# Patient Record
Sex: Female | Born: 1990
Health system: Southern US, Community
[De-identification: ages and names within clinical notes are randomized; demographics above are authoritative.]

## PROBLEM LIST (undated history)

## (undated) ENCOUNTER — Inpatient Hospital Stay (HOSPITAL_COMMUNITY): Payer: Self-pay

## (undated) DIAGNOSIS — D649 Anemia, unspecified: Secondary | ICD-10-CM

## (undated) DIAGNOSIS — F419 Anxiety disorder, unspecified: Secondary | ICD-10-CM

## (undated) DIAGNOSIS — F329 Major depressive disorder, single episode, unspecified: Secondary | ICD-10-CM

## (undated) DIAGNOSIS — F32A Depression, unspecified: Secondary | ICD-10-CM

## (undated) HISTORY — PX: EYE SURGERY: SHX253

## (undated) HISTORY — PX: WISDOM TOOTH EXTRACTION: SHX21

## (undated) HISTORY — DX: Anemia, unspecified: D64.9

## (undated) HISTORY — DX: Major depressive disorder, single episode, unspecified: F32.9

## (undated) HISTORY — DX: Depression, unspecified: F32.A

## (undated) HISTORY — DX: Anxiety disorder, unspecified: F41.9

## (undated) HISTORY — PX: OTHER SURGICAL HISTORY: SHX169

---

## 2011-07-27 ENCOUNTER — Ambulatory Visit (INDEPENDENT_AMBULATORY_CARE_PROVIDER_SITE_OTHER): Payer: 59

## 2011-07-27 DIAGNOSIS — D509 Iron deficiency anemia, unspecified: Secondary | ICD-10-CM

## 2011-07-27 DIAGNOSIS — J039 Acute tonsillitis, unspecified: Secondary | ICD-10-CM

## 2012-03-04 ENCOUNTER — Other Ambulatory Visit: Payer: Self-pay

## 2012-04-28 ENCOUNTER — Other Ambulatory Visit (HOSPITAL_COMMUNITY): Payer: Self-pay | Admitting: Obstetrics and Gynecology

## 2012-04-28 DIAGNOSIS — IMO0002 Reserved for concepts with insufficient information to code with codable children: Secondary | ICD-10-CM

## 2012-05-09 ENCOUNTER — Encounter (HOSPITAL_COMMUNITY): Payer: Self-pay | Admitting: Obstetrics and Gynecology

## 2012-05-16 ENCOUNTER — Ambulatory Visit (HOSPITAL_COMMUNITY)
Admission: RE | Admit: 2012-05-16 | Discharge: 2012-05-16 | Disposition: A | Discharge status: Home / Self Care | Payer: 59 | Source: Ambulatory Visit | Attending: Obstetrics and Gynecology | Admitting: Obstetrics and Gynecology

## 2012-05-16 ENCOUNTER — Encounter (HOSPITAL_COMMUNITY): Payer: Self-pay

## 2012-05-16 VITALS — BP 122/79 | HR 89 | Wt 138.0 lb

## 2012-05-16 DIAGNOSIS — Z363 Encounter for antenatal screening for malformations: Secondary | ICD-10-CM | POA: Insufficient documentation

## 2012-05-16 DIAGNOSIS — Z1389 Encounter for screening for other disorder: Secondary | ICD-10-CM | POA: Insufficient documentation

## 2012-05-16 DIAGNOSIS — IMO0002 Reserved for concepts with insufficient information to code with codable children: Secondary | ICD-10-CM

## 2012-05-16 DIAGNOSIS — O358XX0 Maternal care for other (suspected) fetal abnormality and damage, not applicable or unspecified: Secondary | ICD-10-CM | POA: Insufficient documentation

## 2012-06-13 ENCOUNTER — Ambulatory Visit (HOSPITAL_COMMUNITY): Admission: RE | Admit: 2012-06-13 | Payer: 59 | Source: Ambulatory Visit

## 2012-06-16 ENCOUNTER — Other Ambulatory Visit (HOSPITAL_COMMUNITY): Payer: Self-pay | Admitting: Obstetrics and Gynecology

## 2012-06-16 DIAGNOSIS — IMO0002 Reserved for concepts with insufficient information to code with codable children: Secondary | ICD-10-CM

## 2012-07-09 ENCOUNTER — Encounter (HOSPITAL_COMMUNITY): Payer: Self-pay

## 2012-07-09 ENCOUNTER — Ambulatory Visit (HOSPITAL_COMMUNITY)
Admission: RE | Admit: 2012-07-09 | Discharge: 2012-07-09 | Disposition: A | Discharge status: Home / Self Care | Payer: 59 | Source: Ambulatory Visit | Attending: Obstetrics and Gynecology | Admitting: Obstetrics and Gynecology

## 2012-07-09 VITALS — BP 113/68 | HR 83 | Wt 148.5 lb

## 2012-07-09 DIAGNOSIS — O358XX0 Maternal care for other (suspected) fetal abnormality and damage, not applicable or unspecified: Secondary | ICD-10-CM | POA: Insufficient documentation

## 2012-07-09 DIAGNOSIS — Z3689 Encounter for other specified antenatal screening: Secondary | ICD-10-CM | POA: Insufficient documentation

## 2012-07-09 DIAGNOSIS — IMO0002 Reserved for concepts with insufficient information to code with codable children: Secondary | ICD-10-CM

## 2012-07-09 NOTE — Progress Notes (Signed)
Jade Webster  was seen today for an ultrasound appointment.  See full report in [facility].  Impression: IUP at 29 5/7 weeks Duplicated collecting system on left with partial obstruction at the ureterovesicular junction- left hydroureter noted Grade IV left hydronephrosis; minimal renal tissue visible on the left which appears somewhat echogenic The right kidney and bladder appear normal.  No ureterocele is appreciated. Otherwise, normal interval anatomy. Interval growth is appropriate (78th %tile) Normal amniotic fluid volume   Patient was previous seen by [facility]rology at [facility].  Recommendations: Recommend follow up ultrasound in 4 weeks.  May deliver at [facility] - but will likely need Peds Urology follow up after delivery.  [name], MD

## 2012-08-06 ENCOUNTER — Ambulatory Visit (HOSPITAL_COMMUNITY)
Admission: RE | Admit: 2012-08-06 | Discharge: 2012-08-06 | Disposition: A | Discharge status: Home / Self Care | Payer: 59 | Source: Ambulatory Visit | Attending: Obstetrics and Gynecology | Admitting: Obstetrics and Gynecology

## 2012-08-06 VITALS — BP 119/68 | HR 81 | Wt 154.0 lb

## 2012-08-06 DIAGNOSIS — IMO0002 Reserved for concepts with insufficient information to code with codable children: Secondary | ICD-10-CM

## 2012-08-06 DIAGNOSIS — O358XX0 Maternal care for other (suspected) fetal abnormality and damage, not applicable or unspecified: Secondary | ICD-10-CM | POA: Insufficient documentation

## 2012-08-14 ENCOUNTER — Ambulatory Visit (HOSPITAL_COMMUNITY)
Admission: RE | Admit: 2012-08-14 | Discharge: 2012-08-14 | Disposition: A | Discharge status: Home / Self Care | Payer: 59 | Source: Ambulatory Visit | Attending: Obstetrics and Gynecology | Admitting: Obstetrics and Gynecology

## 2012-08-14 VITALS — BP 120/67 | HR 96 | Wt 155.5 lb

## 2012-08-14 DIAGNOSIS — Q6239 Other obstructive defects of renal pelvis and ureter: Secondary | ICD-10-CM | POA: Insufficient documentation

## 2012-08-14 DIAGNOSIS — IMO0002 Reserved for concepts with insufficient information to code with codable children: Secondary | ICD-10-CM

## 2012-08-14 DIAGNOSIS — O358XX0 Maternal care for other (suspected) fetal abnormality and damage, not applicable or unspecified: Secondary | ICD-10-CM | POA: Insufficient documentation

## 2012-08-14 NOTE — Progress Notes (Signed)
Jade Webster  was seen today for an ultrasound appointment.  See full report in [facility].  Impression: IUP at 34+6 weeks Left duplicated collection system with UVJ obstruction and left hydroureter and grade 2 hydronephrosis on the right kidney Normal amniotic fluid volume (AFI 7.7 cm) BPP 8/10 (-2 for lack of sustained breathing activity), reactive NST  Recommendations: Recommend continued 2x weekly antepartum testing - weekly BPPs / amniotic fluid assessment with MFM and weekly NSTs at referring clinic Will follow up with [name] [facility]  urology) given new finding of right hydronephrosis, but in light of normal amniotic fluid volume, doubt that this will impact on delivery plan  [name], MD

## 2012-08-14 NOTE — Progress Notes (Signed)
Pt manually entered into obix due to invalid patient message.  Search under MR to view fetal tracing.

## 2012-08-18 LAB — OB RESULTS CONSOLE GBS: GBS: NEGATIVE

## 2012-08-21 ENCOUNTER — Ambulatory Visit (HOSPITAL_COMMUNITY)
Admission: RE | Admit: 2012-08-21 | Discharge: 2012-08-21 | Disposition: A | Discharge status: Home / Self Care | Payer: 59 | Source: Ambulatory Visit | Attending: Obstetrics and Gynecology | Admitting: Obstetrics and Gynecology

## 2012-08-21 ENCOUNTER — Other Ambulatory Visit (HOSPITAL_COMMUNITY): Payer: Self-pay | Admitting: Maternal and Fetal Medicine

## 2012-08-21 VITALS — BP 111/77 | HR 86 | Wt 158.0 lb

## 2012-08-21 DIAGNOSIS — IMO0002 Reserved for concepts with insufficient information to code with codable children: Secondary | ICD-10-CM

## 2012-08-21 DIAGNOSIS — O358XX0 Maternal care for other (suspected) fetal abnormality and damage, not applicable or unspecified: Secondary | ICD-10-CM | POA: Insufficient documentation

## 2012-08-21 DIAGNOSIS — Z363 Encounter for antenatal screening for malformations: Secondary | ICD-10-CM | POA: Insufficient documentation

## 2012-08-21 DIAGNOSIS — Z1389 Encounter for screening for other disorder: Secondary | ICD-10-CM | POA: Insufficient documentation

## 2012-08-21 NOTE — Progress Notes (Signed)
Jade Webster  was seen today for an ultrasound appointment.  See full report in [facility].  Impression: IUP at 35+6 weeks Left duplicated collection system with UVJ obstruction and left hydroureter and grade 2 hydronephrosis on the right kidney Normal amniotic fluid volume (AFI 10.8 cm) BPP 8/8  Contracted Dr. Yetta Webster - Peds Urology [facility]) - recommends no change to current plan.  May deliver at [facility] with renal ultrasound after delivery, antibiotic prophylaxis for newborn and follow up with Peds Urology after delivery.  Recommendations: Recommend continued 2x weekly antepartum testing - weekly BPPs / amniotic fluid assessment with MFM and weekly NSTs at referring clinic  Jade Gula, MD

## 2012-08-26 ENCOUNTER — Other Ambulatory Visit (HOSPITAL_COMMUNITY): Payer: Self-pay | Admitting: Obstetrics and Gynecology

## 2012-08-26 DIAGNOSIS — IMO0002 Reserved for concepts with insufficient information to code with codable children: Secondary | ICD-10-CM

## 2012-08-28 ENCOUNTER — Encounter (HOSPITAL_COMMUNITY): Payer: Self-pay

## 2012-08-28 ENCOUNTER — Inpatient Hospital Stay (HOSPITAL_COMMUNITY)
Admission: AD | Admit: 2012-08-28 | Discharge: 2012-08-28 | Disposition: A | Discharge status: Home / Self Care | Payer: 59 | Source: Ambulatory Visit | Attending: Obstetrics and Gynecology | Admitting: Obstetrics and Gynecology

## 2012-08-28 ENCOUNTER — Ambulatory Visit (HOSPITAL_COMMUNITY)
Admission: RE | Admit: 2012-08-28 | Discharge: 2012-08-28 | Disposition: A | Discharge status: Home / Self Care | Payer: 59 | Source: Ambulatory Visit | Attending: Obstetrics and Gynecology | Admitting: Obstetrics and Gynecology

## 2012-08-28 ENCOUNTER — Encounter (HOSPITAL_COMMUNITY): Payer: Self-pay | Admitting: *Deleted

## 2012-08-28 VITALS — BP 122/75 | HR 81 | Wt 161.0 lb

## 2012-08-28 DIAGNOSIS — O358XX0 Maternal care for other (suspected) fetal abnormality and damage, not applicable or unspecified: Secondary | ICD-10-CM | POA: Insufficient documentation

## 2012-08-28 DIAGNOSIS — IMO0002 Reserved for concepts with insufficient information to code with codable children: Secondary | ICD-10-CM

## 2012-08-28 DIAGNOSIS — O360990 Maternal care for other rhesus isoimmunization, unspecified trimester, not applicable or unspecified: Secondary | ICD-10-CM | POA: Insufficient documentation

## 2012-08-28 DIAGNOSIS — Z298 Encounter for other specified prophylactic measures: Secondary | ICD-10-CM | POA: Insufficient documentation

## 2012-08-28 DIAGNOSIS — Z2989 Encounter for other specified prophylactic measures: Secondary | ICD-10-CM | POA: Insufficient documentation

## 2012-08-28 DIAGNOSIS — O479 False labor, unspecified: Secondary | ICD-10-CM

## 2012-08-28 DIAGNOSIS — O4700 False labor before 37 completed weeks of gestation, unspecified trimester: Secondary | ICD-10-CM | POA: Insufficient documentation

## 2012-08-28 NOTE — Progress Notes (Signed)
[facility] ultrasound  Indication: 22 yr old G1P0 at [redacted]w[redacted]d with fetus with left duplicated collecting system; left hydroureter, and bilateral hydronephrosis for biophysical profile.  Findings: 1. Single intrauterine pregnancy. 2. Anterior placenta without evidence of previa. 3. Normal amniotic fluid index; although decreased for gestational age. 4. Again seen is bilateral hydronephrosis and left hydroureter. The bladder and stomach appear normal. 5. Normal biophysical profile of 8/8.  Recommendations: 1. Fetal urologic anomaly: - previously counseled - has met with Pediatric Urology - will deliver at [facility]; neonate will receive antibiotics and ultrasound - appears progressed from previous exam and given decrease in amniotic fluid index recommend reevalaute on 09/01/12- if oligohydramnios at that time or significant worsening or hydronephrosis may consider delivery 2. Continue antenatal testing 3. Fetal growth next week 4. Patient reports leaking of fluid- sent to MAU to rule out rupture of membranes  results given to Dr. Jackelyn Knife  Eulis Foster, MD

## 2012-08-28 NOTE — MAU Note (Addendum)
Sent form MFM- bloody mucous 2 days ago. Last night at work felt trickling, panties have been damp.  AFI decreased on [place] today.

## 2012-08-28 NOTE — Progress Notes (Signed)
FHT from today reviewed.  Reactive NST, no significant ctx.

## 2012-08-28 NOTE — MAU Provider Note (Signed)
Chief Complaint:  possible ROM      HPI: Jade Webster is a 22 y.o. G1P0 at [redacted]w[redacted]d sent from MFM to r/o SROM. Reports trickling and wetness in underwear x 2 days. No gush. Bloody mucus 2 days ago, none today. Serial MFM USs for fetal hydronephrosis. AFI 7 today, was 10 last week. Denies contractions.  Good fetal movement.   Pregnancy Course: otherwise uncomplicated  Past Medical History: Past Medical History  Diagnosis Date  . No pertinent past medical history     Past obstetric history: OB History    Grav Para Term Preterm Abortions TAB SAB Ect Mult Living   1              # Outc Date GA Lbr Len/2nd Wgt Sex Del Anes PTL Lv   1 CUR               Past Surgical History: Past Surgical History  Procedure Date  . Eye surgery   . Wisdom tooth extraction     Family History: Family History  Problem Relation Age of Onset  . Other Neg Hx     Social History: History  Substance Use Topics  . Smoking status: Never Smoker   . Smokeless tobacco: Never Used  . Alcohol Use: No    Allergies: No Known Allergies  Meds:  Prescriptions prior to admission  Medication Sig Dispense Refill  . acetaminophen (TYLENOL) 325 MG tablet Take 650 mg by mouth every 4 (four) hours as needed. For pain      . diphenhydrAMINE (BENADRYL) 25 MG tablet Take 25 mg by mouth once.      . Prenatal Vit-Fe Fumarate-FA (PRENATAL MULTIVITAMIN) TABS Take 1 tablet by mouth daily.        ROS: Pertinent findings in history of present illness.  Physical Exam  Blood pressure 135/83, pulse 102, temperature 98 F (36.7 C), temperature source Oral, resp. rate 20, height 5\' 2"  (1.575 m), weight 161 lb (73.029 kg), last menstrual period 12/13/2011. GENERAL: Well-developed, well-nourished female in no acute distress.  HEENT: normocephalic HEART: normal rate RESP: normal effort ABDOMEN: Soft, non-tender, gravid appropriate for gestational age EXTREMITIES: Nontender, no edema NEURO: alert and oriented SPECULUM  EXAM: NEFG, physiologic discharge, no blood, cervix clean. Neg pool. Neg fern. Dilation: Closed Effacement (%): Thick Exam by:: L. Paschal, RN  FHT:  Baseline 140 , moderate variability, accelerations present, no decelerations Contractions: rare   Labs: No results found for this or any previous visit (from the past 24 hour(s)).  Imaging:  US Ob Limited  08/21/2012  OBSTETRICAL ULTRASOUND: This exam was performed within a [place] Ultrasound Department. The OB US report was generated in the AS system, and faxed to the ordering physician.   This report is also available in [employer] and in the [employer]. See AS Obstetric US report.   US Ob Follow Up  08/06/2012  OBSTETRICAL ULTRASOUND: This exam was performed within a [place] Ultrasound Department. The OB US report was generated in the AS system, and faxed to the ordering physician.   This report is also available in [employer] and in the [employer]. See AS Obstetric US report.   US Fetal Bpp W/o Non Stress  08/28/2012  OBSTETRICAL ULTRASOUND: This exam was performed within a  Ultrasound Department. The OB US report was generated in the AS system, and faxed to the ordering physician.   This report is also available in [employer] and  in the [employer]. See AS Obstetric US report.   US Fetal Bpp W/o Non Stress  08/21/2012  OBSTETRICAL ULTRASOUND: This exam was performed within a [place] Ultrasound Department. The OB US report was generated in the AS system, and faxed to the ordering physician.   This report is also available in [employer] and in the [employer]. See AS Obstetric US report.   US Fetal Bpp W/o Non Stress  08/14/2012  OBSTETRICAL ULTRASOUND: This exam was performed within a [place] Ultrasound Department. The OB US report was generated in the AS system, and faxed to the ordering physician.   This report is also  available in [employer] and in the [employer]. See AS Obstetric US report.   MAU Course: Observed on EFM  Assessment: 1. False labor   G1 at [redacted]w[redacted]d Fetal hydronephrosis AFI 7  Plan: D/W Dr. Jackelyn Knife Discharge home with labor precautions, kick counts Labor precautions and fetal kick counts  Follow-up Information    Follow up with MEISINGER,TODD D, MD. (Keep your scheduled prenatal appointment)    Contact information:   [address], [address] [place] [address] [phone]           Medication List     As of 08/28/2012  2:50 PM    TAKE these medications         acetaminophen 325 MG tablet   Commonly known as: TYLENOL   Take 650 mg by mouth every 4 (four) hours as needed. For pain      diphenhydrAMINE 25 MG tablet   Commonly known as: BENADRYL   Take 25 mg by mouth once.      prenatal multivitamin Tabs   Take 1 tablet by mouth daily.         Danae Orleans, CNM 08/28/2012 12:45 PM

## 2012-08-29 ENCOUNTER — Inpatient Hospital Stay (HOSPITAL_COMMUNITY): Payer: 59 | Admitting: Anesthesiology

## 2012-08-29 ENCOUNTER — Encounter (HOSPITAL_COMMUNITY): Payer: Self-pay | Admitting: *Deleted

## 2012-08-29 ENCOUNTER — Encounter (HOSPITAL_COMMUNITY): Payer: Self-pay | Admitting: Anesthesiology

## 2012-08-29 ENCOUNTER — Inpatient Hospital Stay (HOSPITAL_COMMUNITY)
Admission: AD | Admit: 2012-08-29 | Discharge: 2012-08-31 | DRG: 775 | Disposition: A | Discharge status: Home / Self Care | Payer: 59 | Source: Ambulatory Visit | Attending: Obstetrics and Gynecology | Admitting: Obstetrics and Gynecology

## 2012-08-29 DIAGNOSIS — O4290 Premature rupture of membranes, unspecified as to length of time between rupture and onset of labor, unspecified weeks of gestation: Principal | ICD-10-CM | POA: Diagnosis present

## 2012-08-29 LAB — OB RESULTS CONSOLE GC/CHLAMYDIA: Chlamydia: NEGATIVE

## 2012-08-29 LAB — CBC
HCT: 32.6 % — ABNORMAL LOW (ref 36.0–46.0)
MCV: 87.6 fL (ref 78.0–100.0)
RBC: 3.72 MIL/uL — ABNORMAL LOW (ref 3.87–5.11)
WBC: 11.2 10*3/uL — ABNORMAL HIGH (ref 4.0–10.5)

## 2012-08-29 LAB — ABO/RH: ABO/RH(D): O NEG

## 2012-08-29 LAB — OB RESULTS CONSOLE HIV ANTIBODY (ROUTINE TESTING): HIV: NONREACTIVE

## 2012-08-29 LAB — OB RESULTS CONSOLE RPR: RPR: NONREACTIVE

## 2012-08-29 LAB — OB RESULTS CONSOLE ABO/RH: RH Type: NEGATIVE

## 2012-08-29 LAB — POCT FERN TEST: POCT Fern Test: POSITIVE

## 2012-08-29 LAB — OB RESULTS CONSOLE GBS: GBS: NEGATIVE

## 2012-08-29 MED ORDER — OXYTOCIN 40 UNITS IN LACTATED RINGERS INFUSION - SIMPLE MED
62.5000 mL/h | INTRAVENOUS | Status: AC
  Administered 2012-08-29: 62.5 mL/h via INTRAVENOUS

## 2012-08-29 MED ORDER — TERBUTALINE SULFATE 1 MG/ML IJ SOLN: 0.2500 mg | Freq: Once | INTRAMUSCULAR | Status: DC | PRN

## 2012-08-29 MED ORDER — BUTORPHANOL TARTRATE 1 MG/ML IJ SOLN
1.0000 mg | Freq: Once | INTRAMUSCULAR | Status: AC
  Administered 2012-08-29: 1 mg via INTRAVENOUS

## 2012-08-29 MED ORDER — OXYTOCIN 40 UNITS IN LACTATED RINGERS INFUSION - SIMPLE MED
1.0000 m[IU]/min | INTRAVENOUS | Status: DC
  Administered 2012-08-29: 1 m[IU]/min via INTRAVENOUS
  Administered 2012-08-29: 666 m[IU]/min via INTRAVENOUS
  Filled 2012-08-29: qty 1000

## 2012-08-29 MED ORDER — OXYTOCIN 40 UNITS IN LACTATED RINGERS INFUSION - SIMPLE MED: 62.5000 mL/h | INTRAVENOUS | Status: DC

## 2012-08-29 MED ORDER — ONDANSETRON HCL 4 MG/2ML IJ SOLN
4.0000 mg | Freq: Four times a day (QID) | INTRAMUSCULAR | Status: DC | PRN
  Administered 2012-08-29: 4 mg via INTRAVENOUS
  Filled 2012-08-29: qty 2

## 2012-08-29 MED ORDER — IBUPROFEN 600 MG PO TABS
600.0000 mg | ORAL_TABLET | Freq: Four times a day (QID) | ORAL | Status: DC
  Administered 2012-08-30 – 2012-08-31 (×7): 600 mg via ORAL
  Filled 2012-08-29 (×7): qty 1

## 2012-08-29 MED ORDER — DIPHENHYDRAMINE HCL 50 MG/ML IJ SOLN: 12.5000 mg | INTRAMUSCULAR | Status: DC | PRN

## 2012-08-29 MED ORDER — BUTORPHANOL TARTRATE 1 MG/ML IJ SOLN
INTRAMUSCULAR | Status: AC
  Administered 2012-08-29: 1 mg via INTRAVENOUS
  Filled 2012-08-29: qty 1

## 2012-08-29 MED ORDER — OXYCODONE-ACETAMINOPHEN 5-325 MG PO TABS
1.0000 | ORAL_TABLET | ORAL | Status: DC | PRN
  Administered 2012-08-29 – 2012-08-31 (×10): 1 via ORAL
  Filled 2012-08-29: qty 1
  Filled 2012-08-29: qty 2
  Filled 2012-08-29 (×8): qty 1

## 2012-08-29 MED ORDER — EPHEDRINE 5 MG/ML INJ: 10.0000 mg | INTRAVENOUS | Status: DC | PRN

## 2012-08-29 MED ORDER — CALCIUM CARBONATE ANTACID 500 MG PO CHEW
1.0000 | CHEWABLE_TABLET | Freq: Three times a day (TID) | ORAL | Status: DC
  Administered 2012-08-29 – 2012-08-31 (×5): 200 mg via ORAL
  Filled 2012-08-29 (×5): qty 1

## 2012-08-29 MED ORDER — IBUPROFEN 600 MG PO TABS
600.0000 mg | ORAL_TABLET | Freq: Four times a day (QID) | ORAL | Status: DC | PRN
  Administered 2012-08-29: 600 mg via ORAL
  Filled 2012-08-29: qty 1

## 2012-08-29 MED ORDER — WITCH HAZEL-GLYCERIN EX PADS: 1.0000 "application " | MEDICATED_PAD | CUTANEOUS | Status: DC | PRN

## 2012-08-29 MED ORDER — LACTATED RINGERS IV SOLN: 500.0000 mL | INTRAVENOUS | Status: DC | PRN

## 2012-08-29 MED ORDER — BENZOCAINE-MENTHOL 20-0.5 % EX AERO
1.0000 "application " | INHALATION_SPRAY | CUTANEOUS | Status: DC | PRN
  Administered 2012-08-29 – 2012-08-31 (×2): 1 via TOPICAL
  Filled 2012-08-29 (×2): qty 56

## 2012-08-29 MED ORDER — LANOLIN HYDROUS EX OINT: TOPICAL_OINTMENT | CUTANEOUS | Status: DC | PRN

## 2012-08-29 MED ORDER — LACTATED RINGERS IV SOLN
INTRAVENOUS | Status: DC
  Administered 2012-08-29 (×2): via INTRAVENOUS

## 2012-08-29 MED ORDER — OXYTOCIN BOLUS FROM INFUSION: 500.0000 mL | INTRAVENOUS | Status: DC

## 2012-08-29 MED ORDER — OXYTOCIN 40 UNITS IN LACTATED RINGERS INFUSION - SIMPLE MED
62.5000 mL/h | INTRAVENOUS | Status: DC
  Filled 2012-08-29: qty 1000

## 2012-08-29 MED ORDER — TETANUS-DIPHTH-ACELL PERTUSSIS 5-2.5-18.5 LF-MCG/0.5 IM SUSP
0.5000 mL | Freq: Once | INTRAMUSCULAR | Status: AC
  Administered 2012-08-30: 0.5 mL via INTRAMUSCULAR

## 2012-08-29 MED ORDER — LACTATED RINGERS IV SOLN: 500.0000 mL | Freq: Once | INTRAVENOUS | Status: DC

## 2012-08-29 MED ORDER — LACTATED RINGERS IV SOLN: INTRAVENOUS | Status: DC

## 2012-08-29 MED ORDER — EPHEDRINE 5 MG/ML INJ
10.0000 mg | INTRAVENOUS | Status: DC | PRN
  Filled 2012-08-29: qty 4

## 2012-08-29 MED ORDER — DIPHENHYDRAMINE HCL 25 MG PO CAPS: 25.0000 mg | ORAL_CAPSULE | Freq: Four times a day (QID) | ORAL | Status: DC | PRN

## 2012-08-29 MED ORDER — SENNOSIDES-DOCUSATE SODIUM 8.6-50 MG PO TABS
2.0000 | ORAL_TABLET | Freq: Every day | ORAL | Status: DC
  Administered 2012-08-29 – 2012-08-30 (×2): 2 via ORAL

## 2012-08-29 MED ORDER — SIMETHICONE 80 MG PO CHEW: 80.0000 mg | CHEWABLE_TABLET | ORAL | Status: DC | PRN

## 2012-08-29 MED ORDER — OXYTOCIN 40 UNITS IN LACTATED RINGERS INFUSION - SIMPLE MED: 62.5000 mL/h | INTRAVENOUS | Status: AC | PRN

## 2012-08-29 MED ORDER — FENTANYL 2.5 MCG/ML BUPIVACAINE 1/10 % EPIDURAL INFUSION (WH - ANES)
14.0000 mL/h | INTRAMUSCULAR | Status: DC
  Administered 2012-08-29: 14 mL/h via EPIDURAL
  Filled 2012-08-29: qty 125

## 2012-08-29 MED ORDER — CITRIC ACID-SODIUM CITRATE 334-500 MG/5ML PO SOLN: 30.0000 mL | ORAL | Status: DC | PRN

## 2012-08-29 MED ORDER — LIDOCAINE HCL (PF) 1 % IJ SOLN
INTRAMUSCULAR | Status: DC | PRN
  Administered 2012-08-29 (×2): 5 mL
  Administered 2012-08-29: 30 mL

## 2012-08-29 MED ORDER — PRENATAL MULTIVITAMIN CH: 1.0000 | ORAL_TABLET | Freq: Every day | ORAL | Status: DC

## 2012-08-29 MED ORDER — LIDOCAINE HCL (PF) 1 % IJ SOLN
30.0000 mL | INTRAMUSCULAR | Status: DC | PRN
  Filled 2012-08-29: qty 30

## 2012-08-29 MED ORDER — OXYCODONE-ACETAMINOPHEN 5-325 MG PO TABS: 1.0000 | ORAL_TABLET | ORAL | Status: DC | PRN

## 2012-08-29 MED ORDER — DIBUCAINE 1 % RE OINT: 1.0000 "application " | TOPICAL_OINTMENT | RECTAL | Status: DC | PRN

## 2012-08-29 MED ORDER — MEASLES, MUMPS & RUBELLA VAC ~~LOC~~ INJ
0.5000 mL | INJECTION | Freq: Once | SUBCUTANEOUS | Status: AC
  Administered 2012-08-31: 0.5 mL via SUBCUTANEOUS
  Filled 2012-08-29: qty 0.5

## 2012-08-29 MED ORDER — PRENATAL MULTIVITAMIN CH
1.0000 | ORAL_TABLET | Freq: Every day | ORAL | Status: DC
  Administered 2012-08-30 – 2012-08-31 (×2): 1 via ORAL
  Filled 2012-08-29 (×2): qty 1

## 2012-08-29 MED ORDER — ZOLPIDEM TARTRATE 5 MG PO TABS: 5.0000 mg | ORAL_TABLET | Freq: Every evening | ORAL | Status: DC | PRN

## 2012-08-29 MED ORDER — ACETAMINOPHEN 325 MG PO TABS: 650.0000 mg | ORAL_TABLET | ORAL | Status: DC | PRN

## 2012-08-29 MED ORDER — PHENYLEPHRINE 40 MCG/ML (10ML) SYRINGE FOR IV PUSH (FOR BLOOD PRESSURE SUPPORT)
80.0000 ug | PREFILLED_SYRINGE | INTRAVENOUS | Status: DC | PRN
  Filled 2012-08-29: qty 5

## 2012-08-29 MED ORDER — PHENYLEPHRINE 40 MCG/ML (10ML) SYRINGE FOR IV PUSH (FOR BLOOD PRESSURE SUPPORT): 80.0000 ug | PREFILLED_SYRINGE | INTRAVENOUS | Status: DC | PRN

## 2012-08-29 MED ORDER — ONDANSETRON HCL 4 MG/2ML IJ SOLN: 4.0000 mg | INTRAMUSCULAR | Status: DC | PRN

## 2012-08-29 MED ORDER — ONDANSETRON HCL 4 MG PO TABS: 4.0000 mg | ORAL_TABLET | ORAL | Status: DC | PRN

## 2012-08-29 NOTE — Progress Notes (Signed)
Called dr henley's office.  They stated he had just left and was on his way to hospital.

## 2012-08-29 NOTE — Progress Notes (Signed)
Patient ID: Jade Webster, female   DOB: 24-Jan-1991, 22 y.o.   MRN: [id number] Delivery note:  The pt progressed to full dilatation and pushed well. After 1 and 1/2 hours she delivered a living female infant OA over a second degree laceration and bilateral labial lacerations. Apgars were 8 and 9 at 1 and 5 minutes. The placenta was intact and the uterus was normal. The lacerations were repaired with 3-0 vicryl and 3-0 chromic. EBL 400 cc's. I notified the nursery of the baby's renal problems and I had already notified the pediatrician.

## 2012-08-29 NOTE — Anesthesia Preprocedure Evaluation (Signed)

## 2012-08-29 NOTE — MAU Note (Signed)
Patient states she had a gush of clear fluid at 0720. Having irregular contractions.

## 2012-08-29 NOTE — H&P (Signed)
Jade Webster, PE               ACCOUNT NO.:  [id number]  MEDICAL RECORD NO.:  [id number]  LOCATION:  9171                          FACILITY:  [facility]  PHYSICIAN:  Malachi Pro. Ambrose Mantle, M.D. DATE OF BIRTH:  1991-03-01  DATE OF ADMISSION:  08/29/2012 DATE OF DISCHARGE:                             HISTORY & PHYSICAL   PRESENT ILLNESS:  This is a 22 year old white female, para 0, gravida 1, EDC September 19, 2012, by an ultrasound at 8-3/7th weeks done on February 11, 2012, who is admitted with premature rupture of the membranes and A known history of ureteropelvic junction obstruction in the baby on the left and mild hydronephrosis of the baby on the right.  Blood group and type O negative with a negative antibody, Pap smear normal, rubella equivocal, RPR nonreactive, urine culture negative, hepatitis B surface antigen negative, HIV negative, GC and Chlamydia negative.  First- trimester screen negative.  Cystic fibrosis screen negative.  One-hour Glucola 74.  Group B strep negative.  The patient began her prenatal course at our office on February 18, 2012 at 9-4/7th weeks gestation.  Her prenatal course was complicated at 13 weeks by her sister's death and by ultrasound at 18 weeks that showed one of the baby's kidneys was enlarged with obstruction of the ureteropelvic junction.  She was seen at MFM and followed by MFM as well as [place] and a pediatric urologist at [facility], who was consulted after ultrasound at 26 weeks showed hydronephrosis and hydroureter of the left kidney.  Right kidney was normal.  She went to a pediatric urologist in [place] at 26 weeks. She did receive her RhoGAM at 28 weeks.  At 33 weeks, an ultrasound suggested that there was mild hydronephrosis of the right kidney. Subsequently, the patient was followed closely by MFM and by our office, and because of a question of growth restriction, she began having nonstress test twice weekly and AFI once weekly.  In spite of the  fact the fundal height measurement was low, by ultrasound, the baby was actually larger than average.  The patient began having leakage of fluid this morning at about 7:30 a.m., came to the hospital, rupture of membranes was confirmed, and she was admitted.  PAST MEDICAL HISTORY:  She has always had anemia.  She has had a history of depression related to her sister's death.  She had a history of lice in April 2013, after a trip to [place].  She also has a history of psoriasis in her hairline during the winter.  SURGICAL HISTORY:  She did have eye surgery in 1995 and 1997 for lazy eye.  ALLERGIES:  None known.  FAMILY HISTORY:  Father has an aortic valve disorder as well as diabetes, heart attack.  Her mother has a history of diabetes, ovarian cyst, and irritable bowel.  Sister at age 44 died from heroin and methamphetamine overdose.  Maternal grandmother had bone cancer, celiac disease, and osteoporosis.  Maternal grandfather had a heart attack.  SOCIAL HISTORY:  The patient denies alcohol, tobacco, and illicit substance abuse.  She is a Production assistant, radio at [employer].  She has had some college at the [place] [employer] [place] at [place] and  [place] and [address]  PHYSICAL EXAMINATION:  VITAL SIGNS:  On admission, blood pressure is 113/74, pulse 59, respirations 18, temperature 97.4. HEART:  Normal size and sounds.  No murmurs. LUNGS:  Clear to auscultation. GU:  Fundal height is consistently measured low recently with her most recent exam done on August 25, 2012, the fundal height was 34 cm.  Fetal heart tones are normal.  Cervix per the nurse is 7-8 cm, 100% vertex at a +1 station.  ADMITTING IMPRESSION:  Intrauterine pregnancy at 37 weeks and 0 days, history of obstruction of the left urinary tract of the baby and partial obstruction on the right.  The patient is admitted for observation of labor and the pediatrician has been notified of the baby's situation.     Malachi Pro.  Ambrose Mantle, M.D.     TFH/MEDQ  D:  08/29/2012  T:  08/29/2012  Job:  [id number]

## 2012-08-29 NOTE — Anesthesia Procedure Notes (Signed)
Epidural Patient location during procedure: OB Start time: 08/29/2012 10:22 AM  Staffing Anesthesiologist: Angus Seller., Harrell Gave. Performed by: anesthesiologist   Preanesthetic Checklist Completed: patient identified, site marked, surgical consent, pre-op evaluation, timeout performed, IV checked, risks and benefits discussed and monitors and equipment checked  Epidural Patient position: sitting Prep: site prepped and draped and DuraPrep Patient monitoring: continuous pulse ox and blood pressure Approach: midline Injection technique: LOR air and LOR saline  Needle:  Needle type: Tuohy  Needle gauge: 17 G Needle length: 9 cm and 9 Needle insertion depth: 6 cm Catheter type: closed end flexible Catheter size: 19 Gauge Catheter at skin depth: 11 cm Test dose: negative  Assessment Events: blood not aspirated, injection not painful, no injection resistance, negative IV test and no paresthesia  Additional Notes Patient identified.  Risk benefits discussed including failed block, incomplete pain control, headache, nerve damage, paralysis, blood pressure changes, nausea, vomiting, reactions to medication both toxic or allergic, and postpartum back pain.  Patient expressed understanding and wished to proceed.  All questions were answered.  Sterile technique used throughout procedure and epidural site dressed with sterile barrier dressing. No paresthesia or other complications noted.The patient did not experience any signs of intravascular injection such as tinnitus or metallic taste in mouth nor signs of intrathecal spread such as rapid motor block. Please see nursing notes for vital signs.

## 2012-08-30 LAB — CBC
Hemoglobin: 9.6 g/dL — ABNORMAL LOW (ref 12.0–15.0)
MCHC: 33.1 g/dL (ref 30.0–36.0)
Platelets: 124 10*3/uL — ABNORMAL LOW (ref 150–400)
RBC: 3.28 MIL/uL — ABNORMAL LOW (ref 3.87–5.11)

## 2012-08-30 MED ORDER — INFLUENZA VIRUS VACC SPLIT PF IM SUSP
0.5000 mL | INTRAMUSCULAR | Status: AC
  Administered 2012-08-30: 0.5 mL via INTRAMUSCULAR
  Filled 2012-08-30: qty 0.5

## 2012-08-30 MED ORDER — RHO D IMMUNE GLOBULIN 1500 UNIT/2ML IJ SOLN
300.0000 ug | Freq: Once | INTRAMUSCULAR | Status: AC
  Administered 2012-08-30: 300 ug via INTRAMUSCULAR
  Filled 2012-08-30: qty 2

## 2012-08-30 NOTE — Progress Notes (Signed)
Patient ID: Jade Webster, female   DOB: [birth year], 22 y.o.   MRN: [id number] #1 afebrile BP normal Baby is voiding She has had [place] It shows bilateral hydroureter and hydronephrosis. Dr. Excell Seltzer has spoken to the urologist at [facility] and they plan to see the baby in 2 weeks.

## 2012-08-30 NOTE — Anesthesia Postprocedure Evaluation (Signed)
  Anesthesia Post-op Note  Patient: Jade Webster  Procedure(s) Performed: * No procedures listed *  Patient Location: PACU and Short Stay  Anesthesia Type:Epidural  Level of Consciousness: awake, alert  and oriented  Airway and Oxygen Therapy: Patient Spontanous Breathing  Post-op Pain: mild  Post-op Assessment: Patient's Cardiovascular Status Stable, Respiratory Function Stable, No signs of Nausea or vomiting, Adequate PO intake, Pain level controlled, No headache, No backache, No residual numbness and No residual motor weakness  Post-op Vital Signs: stable  Complications: No apparent anesthesia complications

## 2012-08-31 LAB — RH IG WORKUP (INCLUDES ABO/RH)
Antibody Screen: POSITIVE
Gestational Age(Wks): 37

## 2012-08-31 MED ORDER — IBUPROFEN 600 MG PO TABS: 600.0000 mg | ORAL_TABLET | Freq: Four times a day (QID) | ORAL | Status: DC | PRN

## 2012-08-31 MED ORDER — OXYCODONE-ACETAMINOPHEN 5-325 MG PO TABS: 1.0000 | ORAL_TABLET | Freq: Four times a day (QID) | ORAL | Status: DC | PRN

## 2012-08-31 NOTE — Progress Notes (Signed)
Patient ID: Jade Webster, female   DOB: Nov 15, 1990, 22 y.o.   MRN: [id number] #[id number] afebrile BP normal for d/c

## 2012-08-31 NOTE — Discharge Summary (Signed)
NAMESAORI, UMHOLTZ               ACCOUNT NO.:  [id number]  MEDICAL RECORD NO.:  [id number]  LOCATION:  9142                          [facility]ACILITY:  [facility]  PHYSICIAN:  Malachi Pro. Ambrose Mantle, M.D. DATE OF BIRTH:  May 24, 1991  DATE OF ADMISSION:  08/29/2012 DATE OF DISCHARGE:  08/31/2012                              DISCHARGE SUMMARY   HISTORY:  A 22 year old white female, para 0, gravida 1, EDC September 19, 2012, by an ultrasound at 8 weeks and 3 days done on February 11, 2012, who was admitted with premature rupture of the membranes, an unknown history of hydronephrosis and hydroureter on the left in a milder form of the same problem on the right.  The patient was O negative with a negative antibody.  Pap smear normal.  Rubella equivocal.  RPR nonreactive. Urine culture negative.  Hepatitis B surface antigen and HIV negative. GC and Chlamydia negative.  First trimester screen negative.  Cystic fibrosis negative.  Group B strep negative.  The patient had been seen in consultation during pregnancy with EMFM and by pediatric urologist at [facility].  After admission to the hospital, the patient was found to be 7-8 cm, 100% effaced, vertex at a +1 station.  She progressed to full dilatation, pushed well and after 1.5 hour she delivered a living female infant, OA over a second-degree laceration and bilateral labial lacerations.  Apgars were 8 and 9 at one and  five minutes.  Placenta was intact.  The uterus is normal. Lacerations repaired with 3-0 Vicryl and 3-0 chromic.  Blood loss about 400 mL.  I had spoken to the patient's pediatrician and the nursery about the baby's kidney problems and so they took over the care of the baby.  Postpartum, the patient did well and was discharged on the second postpartum day.  The pediatricians are caring for the baby.  Baby has had an ultrasound as a newborn and also was on antibiotics and will see the pediatric urologist in 2 weeks.  FINAL  DIAGNOSES:  Intrauterine pregnancy at 37 weeks, delivered vertex, premature rupture of the membranes.  OPERATIONS:  Spontaneous delivery, vertex,  repair of labial lacerations.  FINAL CONDITION:  Improved.  INSTRUCTIONS:  Include our regular discharge instruction booklet as well as the after visit summary.  DISCHARGE MEDICATIONS:  Percocet 5/325, 30 tablets, 1 every 6 hours as needed for pain and Motrin 600 mg 30 tablets, 1 every 6 hours as needed for pain. The patient is advised to continue her prenatal vitamins and return to the office in 6 weeks for followup examination.  CBC on admission, hemoglobin 11.1, hematocrit 32.6, white count 11200, platelet count 122,000.  The patient was O negative.  She did receive RhoGAM on August 30, 2012.  Followup hemoglobin was 9.6, platelet count 124,000.     Malachi Pro. Ambrose Mantle, M.D.     TFH/MEDQ  D:  08/31/2012  T:  08/31/2012  Job:  [id number]

## 2012-09-01 ENCOUNTER — Ambulatory Visit (HOSPITAL_COMMUNITY): Payer: 59

## 2013-01-16 ENCOUNTER — Other Ambulatory Visit: Payer: Self-pay | Admitting: Internal Medicine

## 2013-01-16 DIAGNOSIS — R109 Unspecified abdominal pain: Secondary | ICD-10-CM

## 2013-01-20 ENCOUNTER — Other Ambulatory Visit: Payer: Self-pay

## 2013-08-27 ENCOUNTER — Telehealth: Payer: Self-pay | Admitting: Obstetrics and Gynecology

## 2013-08-27 NOTE — Telephone Encounter (Signed)
Spoke with patient she was concerned about some abnormal bleeding, Implanon was inserted 08/2011 3-4 weeks after she gave birth only breast feed for 1-2 weeks. Bleeding a little better now pt okay waiting To Annual Exam Appt 09/03/13

## 2013-08-27 NOTE — Telephone Encounter (Signed)
Agree 

## 2013-08-27 NOTE — Telephone Encounter (Signed)
Patient has been bleeding for 2 weeks heavier than usual. Started on Dec 23 stopped for a day. But then started up again. At first she thought it was just her cycle even thought it was a few days early but it has continued. Has only been seen here once in 2013 had a baby last jan. Was calling to schedule her aex have it scheduled for next Thursday but i thought she amy been to come in sooner for bleeding.

## 2013-08-27 NOTE — Telephone Encounter (Signed)
LMTCB 08/27/13

## 2013-09-03 ENCOUNTER — Ambulatory Visit: Payer: 59 | Admitting: Certified Nurse Midwife

## 2013-10-13 ENCOUNTER — Encounter: Payer: Self-pay | Admitting: Certified Nurse Midwife

## 2013-10-14 ENCOUNTER — Ambulatory Visit (INDEPENDENT_AMBULATORY_CARE_PROVIDER_SITE_OTHER): Payer: 59 | Admitting: Certified Nurse Midwife

## 2013-10-14 ENCOUNTER — Encounter: Payer: Self-pay | Admitting: Certified Nurse Midwife

## 2013-10-14 VITALS — BP 119/74 | HR 84 | Resp 16 | Ht 62.25 in | Wt 132.0 lb

## 2013-10-14 DIAGNOSIS — N760 Acute vaginitis: Secondary | ICD-10-CM

## 2013-10-14 DIAGNOSIS — Z01419 Encounter for gynecological examination (general) (routine) without abnormal findings: Secondary | ICD-10-CM

## 2013-10-14 DIAGNOSIS — Z0000 Encounter for general adult medical examination without abnormal findings: Secondary | ICD-10-CM

## 2013-10-14 LAB — POCT URINALYSIS DIPSTICK
Bilirubin, UA: NEGATIVE
Blood, UA: NEGATIVE
GLUCOSE UA: NEGATIVE
KETONES UA: NEGATIVE
Leukocytes, UA: NEGATIVE
Nitrite, UA: NEGATIVE
Protein, UA: NEGATIVE
Urobilinogen, UA: NEGATIVE
pH, UA: 5

## 2013-10-14 LAB — HEMOGLOBIN, FINGERSTICK: HEMOGLOBIN, FINGERSTICK: 12.8 g/dL (ref 12.0–16.0)

## 2013-10-14 LAB — RPR

## 2013-10-14 MED ORDER — METRONIDAZOLE 500 MG PO TABS: 500.0000 mg | ORAL_TABLET | Freq: Two times a day (BID) | ORAL | Status: DC

## 2013-10-14 NOTE — Progress Notes (Signed)
23 y.o. G1P1001 Single Caucasian Fe here for annual exam. Periods up to December 2014, were light and scant until then. Has Implanon for contraception, and aware of bleeding profile. The past month she has experienced intermittent bleeding and spotting, no sexual activity since 07/2013. No cramping with bleeding. Desires STD screening today due to trust issues with previous partner and FOB of her child. Patient complaining of increase of vaginal discharge with odor for a couple weeks. No new personal products. Sees PCP for vitamin B12 and Vitamin De deficiency every 6 months. No other health issues.  Patient's last menstrual period was 09/28/2013.          Sexually active: yes  The current method of family planning is implanon.    Exercising: no  exercise Smoker:  no  Health Maintenance: Pap:  08-28-11 neg MMG:  none Colonoscopy:  none BMD:   none TDaP:  08-30-12 Labs: Poct urine-neg,hgb-12.8 Self breast exam:every few months   reports that she has never smoked. She has never used smokeless tobacco. She reports that she drinks alcohol. She reports that she does not use illicit drugs.  Past Medical History  Diagnosis Date  . No pertinent past medical history   . Anemia   . Anxiety     Past Surgical History  Procedure Laterality Date  . Eye surgery      for lazy eye  . Wisdom tooth extraction      Current Outpatient Prescriptions  Medication Sig Dispense Refill  . calcium carbonate (TUMS - DOSED IN MG ELEMENTAL CALCIUM) 500 MG chewable tablet Chew 1 tablet by mouth as needed. heartburn      . ibuprofen (ADVIL,MOTRIN) 600 MG tablet Take 1 tablet (600 mg total) by mouth every 6 (six) hours as needed for pain.  30 tablet  0   No current facility-administered medications for this visit.    Family History  Problem Relation Age of Onset  . Other Neg Hx   . Diabetes Father   . Heart attack Father   . COPD Maternal Grandmother   . Cancer Maternal Grandmother     bone  . Stroke  Maternal Grandfather     ROS:  Pertinent items are noted in HPI.  Otherwise, a comprehensive ROS was negative.  Exam:   BP 119/74  Pulse 84  Resp 16  Ht 5' 2.25" (1.581 m)  Wt 132 lb (59.875 kg)  BMI 23.95 kg/m2  LMP 09/28/2013 Height: 5' 2.25" (158.1 cm)  Ht Readings from Last 3 Encounters:  10/14/13 5' 2.25" (1.581 m)  08/29/12 5\' 2"  (1.575 m)  08/28/12 5\' 2"  (1.575 m)    General appearance: alert, cooperative and appears stated age Head: Normocephalic, without obvious abnormality, atraumatic Neck: no adenopathy, supple, symmetrical, trachea midline and thyroid normal to inspection and palpation Implanon palpated in left arm Lungs: clear to auscultation bilaterally Breasts: normal appearance, no masses or tenderness, No nipple retraction or dimpling, No nipple discharge or bleeding, No axillary or supraclavicular adenopathy Heart: regular rate and rhythm Abdomen: soft, non-tender; no masses,  no organomegaly Extremities: extremities normal, atraumatic, no cyanosis or edema Skin: Skin color, texture, turgor normal. No rashes or lesions Lymph nodes: Cervical, supraclavicular, and axillary nodes normal. No abnormal inguinal nodes palpated Neurologic: Grossly normal   Pelvic: External genitalia:  no lesions              Urethra:  normal appearing urethra with no masses, tenderness or lesions  Bartholin's and Skene's: normal                 Vagina: normal appearing vagina with normal color and grey odorous,  discharge, no lesions              Cervix: normal, non tender              Pap taken: yes Bimanual Exam:  Uterus:  normal size, contour, position, consistency, mobility, non-tender and anteverted              Adnexa: normal adnexa and no mass, fullness, tenderness               Rectovaginal: Confirms               Anus:  normal sphincter tone, no lesions  A:  Well Woman with normal exam  Contraception Implanon due for removal 2/17  STD screening  BV  P:    Reviewed health and wellness pertinent to exam  Discussed bleeding profile and will keep menses calendar,also encouraged to try Advil 600mg  every 6 hours for 24 hours when occurs to see if this lessens, will advise  Lab:GC,Chlamydia, HIV,RPR,Hepatitis C, HSV1,2  Reviewed findings of BV, etiology, questions answered.  Rx Flagyl see order, instructions  Pap smear as per guidelines   pap smear taken today  counseled on breast self exam, STD prevention, HIV risk factors and prevention, adequate intake of calcium and vitamin D, diet and exercise  return annually or prn  An After Visit Summary was printed and given to the patient.

## 2013-10-14 NOTE — Patient Instructions (Addendum)
Bacterial Vaginosis Bacterial vaginosis is a vaginal infection that occurs when the normal balance of bacteria in the vagina is disrupted. It results from an overgrowth of certain bacteria. This is the most common vaginal infection in women of childbearing age. Treatment is important to prevent complications, especially in pregnant women, as it can cause a premature delivery. CAUSES  Bacterial vaginosis is caused by an increase in harmful bacteria that are normally present in smaller amounts in the vagina. Several different kinds of bacteria can cause bacterial vaginosis. However, the reason that the condition develops is not fully understood. RISK FACTORS Certain activities or behaviors can put you at an increased risk of developing bacterial vaginosis, including:  Having a new sex partner or multiple sex partners.  Douching.  Using an intrauterine device (IUD) for contraception. Women do not get bacterial vaginosis from toilet seats, bedding, swimming pools, or contact with objects around them. SIGNS AND SYMPTOMS  Some women with bacterial vaginosis have no signs or symptoms. Common symptoms include:  Grey vaginal discharge.  A fishlike odor with discharge, especially after sexual intercourse.  Itching or burning of the vagina and vulva.  Burning or pain with urination. DIAGNOSIS  Your health care provider will take a medical history and examine the vagina for signs of bacterial vaginosis. A sample of vaginal fluid may be taken. Your health care provider will look at this sample under a microscope to check for bacteria and abnormal cells. A vaginal pH test may also be done.  TREATMENT  Bacterial vaginosis may be treated with antibiotic medicines. These may be given in the form of a pill or a vaginal cream. A second round of antibiotics may be prescribed if the condition comes back after treatment.  HOME CARE INSTRUCTIONS   Only take over-the-counter or prescription medicines as  directed by your health care provider.  If antibiotic medicine was prescribed, take it as directed. Make sure you finish it even if you start to feel better.  Do not have sex until treatment is completed.  Tell all sexual partners that you have a vaginal infection. They should see their health care provider and be treated if they have problems, such as a mild rash or itching.  Practice safe sex by using condoms and only having one sex partner. SEEK MEDICAL CARE IF:   Your symptoms are not improving after 3 days of treatment.  You have increased discharge or pain.  You have a fever. MAKE SURE YOU:   Understand these instructions.  Will watch your condition.  Will get help right away if you are not doing well or get worse. FOR MORE INFORMATION  Centers for Disease Control and Prevention, Division of STD Prevention: AppraiserFraud.fi American Sexual Health Association (ASHA): www.ashastd.org  Document Released: 08/06/2005 Document Revised: 05/27/2013 Document Reviewed: 03/18/2013 [facility] Patient Information 2014 [place].       General topics  Next pap or exam is  due in 1 year Take a Women's multivitamin Take 1200 mg. of calcium daily - prefer dietary If any concerns in interim to call back  Breast Self-Awareness Practicing breast self-awareness may pick up problems early, prevent significant medical complications, and possibly save your life. By practicing breast self-awareness, you can become familiar with how your breasts look and feel and if your breasts are changing. This allows you to notice changes early. It can also offer you some reassurance that your breast health is good. One way to learn what is normal for your breasts and whether your  breasts are changing is to do a breast self-exam. If you find a lump or something that was not present in the past, it is best to contact your caregiver right away. Other findings that should be evaluated by your caregiver include  nipple discharge, especially if it is bloody; skin changes or reddening; areas where the skin seems to be pulled in (retracted); or new lumps and bumps. Breast pain is seldom associated with cancer (malignancy), but should also be evaluated by a caregiver. BREAST SELF-EXAM The best time to examine your breasts is 5 7 days after your menstrual period is over.  [employer] Patient Information 2013 [place].   Exercise to Stay Healthy Exercise helps you become and stay healthy. EXERCISE IDEAS AND TIPS Choose exercises that:  You enjoy.  Fit into your day. You do not need to exercise really hard to be healthy. You can do exercises at a slow or medium level and stay healthy. You can:  Stretch before and after working out.  Try yoga, Pilates, or tai chi.  Lift weights.  Walk fast, swim, jog, run, climb stairs, bicycle, dance, or rollerskate.  Take aerobic classes. Exercises that burn about 150 calories:  Running 1  miles in 15 minutes.  Playing volleyball for 45 to 60 minutes.  Washing and waxing a car for 45 to 60 minutes.  Playing touch football for 45 minutes.  Walking 1  miles in 35 minutes.  Pushing a stroller 1  miles in 30 minutes.  Playing basketball for 30 minutes.  Raking leaves for 30 minutes.  Bicycling 5 miles in 30 minutes.  Walking 2 miles in 30 minutes.  Dancing for 30 minutes.  Shoveling snow for 15 minutes.  Swimming laps for 20 minutes.  Walking up stairs for 15 minutes.  Bicycling 4 miles in 15 minutes.  Gardening for 30 to 45 minutes.  Jumping rope for 15 minutes.  Washing windows or floors for 45 to 60 minutes. Document Released: 09/08/2010 Document Revised: 10/29/2011 Document Reviewed: 09/08/2010 [facility] Patient Information 2013 [place].   Other topics ( that may be useful information):    Sexually Transmitted Disease Sexually transmitted disease (STD) refers to any infection that is passed from person to person  during sexual activity. This may happen by way of saliva, semen, blood, vaginal mucus, or urine. Common STDs include:  Gonorrhea.  Chlamydia.  Syphilis.  HIV/AIDS.  Genital herpes.  Hepatitis B and C.  Trichomonas.  Human papillomavirus (HPV).  Pubic lice. CAUSES  An STD may be spread by bacteria, virus, or parasite. A person can get an STD by:  Sexual intercourse with an infected person.  Sharing sex toys with an infected person.  Sharing needles with an infected person.  Having intimate contact with the genitals, mouth, or rectal areas of an infected person. SYMPTOMS  Some people may not have any symptoms, but they can still pass the infection to others. Different STDs have different symptoms. Symptoms include:  Painful or bloody urination.  Pain in the pelvis, abdomen, vagina, anus, throat, or eyes.  Skin rash, itching, irritation, growths, or sores (lesions). These usually occur in the genital or anal area.  Abnormal vaginal discharge.  Penile discharge in men.  Soft, flesh-colored skin growths in the genital or anal area.  Fever.  Pain or bleeding during sexual intercourse.  Swollen glands in the groin area.  Yellow skin and eyes (jaundice). This is seen with hepatitis. DIAGNOSIS  To make a diagnosis, your caregiver may:  Take  a medical history.  Perform a physical exam.  Take a specimen (culture) to be examined.  Examine a sample of discharge under a microscope.  Perform blood test TREATMENT   Chlamydia, gonorrhea, trichomonas, and syphilis can be cured with antibiotic medicine.  Genital herpes, hepatitis, and HIV can be treated, but not cured, with prescribed medicines. The medicines will lessen the symptoms.  Genital warts from HPV can be treated with medicine or by freezing, burning (electrocautery), or surgery. Warts may come back.  HPV is a virus and cannot be cured with medicine or surgery.However, abnormal areas may be followed very  closely by your caregiver and may be removed from the cervix, vagina, or vulva through office procedures or surgery. If your diagnosis is confirmed, your recent sexual partners need treatment. This is true even if they are symptom-free or have a negative culture or evaluation. They should not have sex until their caregiver says it is okay. HOME CARE INSTRUCTIONS  All sexual partners should be informed, tested, and treated for all STDs.  Take your antibiotics as directed. Finish them even if you start to feel better.  Only take over-the-counter or prescription medicines for pain, discomfort, or fever as directed by your caregiver.  Rest.  Eat a balanced diet and drink enough fluids to keep your urine clear or pale yellow.  Do not have sex until treatment is completed and you have followed up with your caregiver. STDs should be checked after treatment.  Keep all follow-up appointments, Pap tests, and blood tests as directed by your caregiver.  Only use latex condoms and water-soluble lubricants during sexual activity. Do not use petroleum jelly or oils.  Avoid alcohol and illegal drugs.  Get vaccinated for HPV and hepatitis. If you have not received these vaccines in the past, talk to your caregiver about whether one or both might be right for you.  Avoid risky sex practices that can break the skin. The only way to avoid getting an STD is to avoid all sexual activity.Latex condoms and dental dams (for oral sex) will help lessen the risk of getting an STD, but will not completely eliminate the risk. SEEK MEDICAL CARE IF:   You have a fever.  You have any new or worsening symptoms. Document Released: 10/27/2002 Document Revised: 10/29/2011 Document Reviewed: 11/03/2010 [facility] Patient Information 2013 [place].    Domestic Abuse You are being battered or abused if someone close to you hits, pushes, or physically hurts you in any way. You also are being abused if you are  forced into activities. You are being sexually abused if you are forced to have sexual contact of any kind. You are being emotionally abused if you are made to feel worthless or if you are constantly threatened. It is important to remember that help is available. No one has the right to abuse you. PREVENTION OF FURTHER ABUSE  Learn the warning signs of danger. This varies with situations but may include: the use of alcohol, threats, isolation from friends and family, or forced sexual contact. Leave if you feel that violence is going to occur.  If you are attacked or beaten, report it to the police so the abuse is documented. You do not have to press charges. The police can protect you while you or the attackers are leaving. Get the officer's name and badge number and a copy of the report.  Find someone you can trust and tell them what is happening to you: your caregiver, a nurse, clergy  member, close friend or family member. Feeling ashamed is natural, but remember that you have done nothing wrong. No one deserves abuse. Document Released: 08/03/2000 Document Revised: 10/29/2011 Document Reviewed: 10/12/2010 [facility] Patient Information 2013 [place].    How Much is Too Much Alcohol? Drinking too much alcohol can cause injury, accidents, and health problems. These types of problems can include:   Car crashes.  Falls.  Family fighting (domestic violence).  Drowning.  Fights.  Injuries.  Burns.  Damage to certain organs.  Having a baby with birth defects. ONE DRINK CAN BE TOO MUCH WHEN YOU ARE:  Working.  Pregnant or breastfeeding.  Taking medicines. Ask your doctor.  Driving or planning to drive. If you or someone you know has a drinking problem, get help from a doctor.  Document Released: 06/02/2009 Document Revised: 10/29/2011 Document Reviewed: 06/02/2009 [facility] Patient Information 2013 [place].   Smoking Hazards Smoking cigarettes is extremely bad  for your health. Tobacco smoke has over 200 known poisons in it. There are over 60 chemicals in tobacco smoke that cause cancer. Some of the chemicals found in cigarette smoke include:   Cyanide.  Benzene.  Formaldehyde.  Methanol (wood alcohol).  Acetylene (fuel used in welding torches).  Ammonia. Cigarette smoke also contains the poisonous gases nitrogen oxide and carbon monoxide.  Cigarette smokers have an increased risk of many serious medical problems and Smoking causes approximately:  90% of all lung cancer deaths in men.  80% of all lung cancer deaths in women.  90% of deaths from chronic obstructive lung disease. Compared with nonsmokers, smoking increases the risk of:  Coronary heart disease by 2 to 4 times.  Stroke by 2 to 4 times.  Men developing lung cancer by 23 times.  Women developing lung cancer by 13 times.  Dying from chronic obstructive lung diseases by 12 times.  . Smoking is the most preventable cause of death and disease in our society.  WHY IS SMOKING ADDICTIVE?  Nicotine is the chemical agent in tobacco that is capable of causing addiction or dependence.  When you smoke and inhale, nicotine is absorbed rapidly into the bloodstream through your lungs. Nicotine absorbed through the lungs is capable of creating a powerful addiction. Both inhaled and non-inhaled nicotine may be addictive.  Addiction studies of cigarettes and spit tobacco show that addiction to nicotine occurs mainly during the teen years, when young people begin using tobacco products. WHAT ARE THE BENEFITS OF QUITTING?  There are many health benefits to quitting smoking.   Likelihood of developing cancer and heart disease decreases. Health improvements are seen almost immediately.  Blood pressure, pulse rate, and breathing patterns start returning to normal soon after quitting. QUITTING SMOKING   American Lung Association - 1-800-LUNGUSA  American Cancer Society -  1-800-ACS-2345 Document Released: 09/13/2004 Document Revised: 10/29/2011 Document Reviewed: 05/18/2009 [facility] Patient Information 2013 [place].   Stress Management Stress is a state of physical or mental tension that often results from changes in your life or normal routine. Some common causes of stress are:  Death of a loved one.  Injuries or severe illnesses.  Getting fired or changing jobs.  Moving into a new home. Other causes may be:  Sexual problems.  Business or financial losses.  Taking on a large debt.  Regular conflict with someone at home or at work.  Constant tiredness from lack of sleep. It is not just bad things that are stressful. It may be stressful to:  Win the lottery.  Get married.  Buy a new car. The amount of stress that can be easily tolerated varies from person to person. Changes generally cause stress, regardless of the types of change. Too much stress can affect your health. It may lead to physical or emotional problems. Too little stress (boredom) may also become stressful. SUGGESTIONS TO REDUCE STRESS:  Talk things over with your family and friends. It often is helpful to share your concerns and worries. If you feel your problem is serious, you may want to get help from a professional counselor.  Consider your problems one at a time instead of lumping them all together. Trying to take care of everything at once may seem impossible. List all the things you need to do and then start with the most important one. Set a goal to accomplish 2 or 3 things each day. If you expect to do too many in a single day you will naturally fail, causing you to feel even more stressed.  Do not use alcohol or drugs to relieve stress. Although you may feel better for a short time, they do not remove the problems that caused the stress. They can also be habit forming.  Exercise regularly - at least 3 times per week. Physical exercise can help to relieve that  "uptight" feeling and will relax you.  The shortest distance between despair and hope is often a good night's sleep.  Go to bed and get up on time allowing yourself time for appointments without being rushed.  Take a short "time-out" period from any stressful situation that occurs during the day. Close your eyes and take some deep breaths. Starting with the muscles in your face, tense them, hold it for a few seconds, then relax. Repeat this with the muscles in your neck, shoulders, hand, stomach, back and legs.  Take good care of yourself. Eat a balanced diet and get plenty of rest.  Schedule time for having fun. Take a break from your daily routine to relax. HOME CARE INSTRUCTIONS   Call if you feel overwhelmed by your problems and feel you can no longer manage them on your own.  Return immediately if you feel like hurting yourself or someone else. Document Released: 01/30/2001 Document Revised: 10/29/2011 Document Reviewed: 09/22/2007 [facility] Patient Information 2013 [place].

## 2013-10-14 NOTE — Progress Notes (Signed)
Reviewed personally.  M. Suzanne Lorah Kalina, MD.  

## 2013-10-15 LAB — HIV ANTIBODY (ROUTINE TESTING W REFLEX): HIV: NONREACTIVE

## 2013-10-15 LAB — HEPATITIS C ANTIBODY: HCV AB: NEGATIVE

## 2013-10-16 LAB — IPS PAP TEST WITH REFLEX TO HPV

## 2013-10-16 LAB — IPS N GONORRHOEA AND CHLAMYDIA BY PCR

## 2013-10-19 LAB — HSV(HERPES SIMPLEX VRS) I + II AB-IGG
HSV 1 Glycoprotein G Ab, IgG: 0.68 IV
HSV 2 Glycoprotein G Ab, IgG: 0.12 IV

## 2013-10-20 ENCOUNTER — Telehealth: Payer: Self-pay

## 2013-10-20 NOTE — Telephone Encounter (Signed)
Message copied by Susy Manor on Tue Oct 20, 2013 10:38 AM ------      Message from: Regina Eck      Created: Mon Oct 19, 2013  7:34 PM       Notify patient that GC,Chlamydia, HIV,RPR,Hep C, HSV 1,2 all negative      Pap smear negative 02 ------

## 2013-10-20 NOTE — Telephone Encounter (Signed)
No voicemail set up. Try again. 

## 2013-10-20 NOTE — Telephone Encounter (Signed)
Patient notified of results.

## 2013-10-23 ENCOUNTER — Ambulatory Visit (INDEPENDENT_AMBULATORY_CARE_PROVIDER_SITE_OTHER): Payer: 59 | Admitting: Physician Assistant

## 2013-10-23 ENCOUNTER — Ambulatory Visit (HOSPITAL_COMMUNITY)
Admission: RE | Admit: 2013-10-23 | Discharge: 2013-10-23 | Disposition: A | Discharge status: Home / Self Care | Payer: 59 | Source: Ambulatory Visit | Attending: Physician Assistant | Admitting: Physician Assistant

## 2013-10-23 ENCOUNTER — Encounter: Payer: Self-pay | Admitting: Physician Assistant

## 2013-10-23 VITALS — BP 100/62 | HR 80 | Temp 98.2°F | Resp 16 | Ht 62.0 in | Wt 136.0 lb

## 2013-10-23 DIAGNOSIS — R5381 Other malaise: Secondary | ICD-10-CM

## 2013-10-23 DIAGNOSIS — M549 Dorsalgia, unspecified: Secondary | ICD-10-CM | POA: Insufficient documentation

## 2013-10-23 DIAGNOSIS — M542 Cervicalgia: Secondary | ICD-10-CM

## 2013-10-23 DIAGNOSIS — E559 Vitamin D deficiency, unspecified: Secondary | ICD-10-CM

## 2013-10-23 DIAGNOSIS — M25579 Pain in unspecified ankle and joints of unspecified foot: Secondary | ICD-10-CM

## 2013-10-23 DIAGNOSIS — R5383 Other fatigue: Secondary | ICD-10-CM

## 2013-10-23 DIAGNOSIS — M26609 Unspecified temporomandibular joint disorder, unspecified side: Secondary | ICD-10-CM

## 2013-10-23 DIAGNOSIS — R790 Abnormal level of blood mineral: Secondary | ICD-10-CM

## 2013-10-23 LAB — CBC WITH DIFFERENTIAL/PLATELET
Basophils Absolute: 0.1 K/uL (ref 0.0–0.1)
Basophils Relative: 1 % (ref 0–1)
Eosinophils Absolute: 0.1 K/uL (ref 0.0–0.7)
Eosinophils Relative: 2 % (ref 0–5)
HCT: 37.1 % (ref 36.0–46.0)
Hemoglobin: 12.5 g/dL (ref 12.0–15.0)
Lymphocytes Relative: 30 % (ref 12–46)
Lymphs Abs: 1.9 K/uL (ref 0.7–4.0)
MCH: 29.2 pg (ref 26.0–34.0)
MCHC: 33.7 g/dL (ref 30.0–36.0)
MCV: 86.7 fL (ref 78.0–100.0)
Monocytes Absolute: 0.6 K/uL (ref 0.1–1.0)
Monocytes Relative: 9 % (ref 3–12)
Neutro Abs: 3.6 K/uL (ref 1.7–7.7)
Neutrophils Relative %: 58 % (ref 43–77)
Platelets: 212 K/uL (ref 150–400)
RBC: 4.28 MIL/uL (ref 3.87–5.11)
RDW: 13.8 % (ref 11.5–15.5)
WBC: 6.2 K/uL (ref 4.0–10.5)

## 2013-10-23 LAB — HEPATIC FUNCTION PANEL
ALBUMIN: 4.3 g/dL (ref 3.5–5.2)
ALK PHOS: 33 U/L — AB (ref 39–117)
ALT: 12 U/L (ref 0–35)
AST: 15 U/L (ref 0–37)
Bilirubin, Direct: 0.2 mg/dL (ref 0.0–0.3)
Indirect Bilirubin: 0.4 mg/dL (ref 0.2–1.2)
TOTAL PROTEIN: 6.5 g/dL (ref 6.0–8.3)
Total Bilirubin: 0.6 mg/dL (ref 0.2–1.2)

## 2013-10-23 LAB — BASIC METABOLIC PANEL WITHOUT GFR
BUN: 13 mg/dL (ref 6–23)
CO2: 27 meq/L (ref 19–32)
Calcium: 8.9 mg/dL (ref 8.4–10.5)
Chloride: 105 meq/L (ref 96–112)
Creat: 0.6 mg/dL (ref 0.50–1.10)
GFR, Est African American: >89 mL/min
GFR, Est Non African American: >89 mL/min
Glucose, Bld: 89 mg/dL (ref 70–99)
Potassium: 4 meq/L (ref 3.5–5.3)
Sodium: 138 meq/L (ref 135–145)

## 2013-10-23 LAB — URIC ACID: Uric Acid, Serum: 3.9 mg/dL (ref 2.4–7.0)

## 2013-10-23 LAB — MAGNESIUM: Magnesium: 1.9 mg/dL (ref 1.5–2.5)

## 2013-10-23 MED ORDER — CELECOXIB 200 MG PO CAPS: 200.0000 mg | ORAL_CAPSULE | Freq: Two times a day (BID) | ORAL | Status: DC

## 2013-10-23 MED ORDER — CYCLOBENZAPRINE HCL 10 MG PO TABS: 10.0000 mg | ORAL_TABLET | Freq: Two times a day (BID) | ORAL | Status: AC | PRN

## 2013-10-23 NOTE — Progress Notes (Signed)
   Subjective:    Patient ID: Jade Webster, female    DOB: Sep 09, 1990, 23 y.o.   MRN: [id number]  HPI 23 y.o. female presents for 6 month follow up. Had follow up at OB/GYN for lower sex drive/lower back pain/abnormal bleeding- had negative work up. She currently complains of LBP, neck pain, burnng pain between her shoulders, legs myalgias that keep her up. Still on vitamin D and sublingual B12, Magnesium. Off Wellbutrin due to short temper/anxiety, celexa low sex drive. Occasionally takes Ibuprofen. Some dry skin above ears, states no last stiffness in the AM, more sore all day. Denies fever, chills, numbness/tingling hands and feet (some numbness in left big toe only occ).   Last visit she had a negative CPK, RF, ANA, DNA.  Review of Systems  Constitutional: Positive for fatigue. Negative for fever and chills.  HENT: Negative.   Eyes: Negative.   Respiratory: Negative.   Cardiovascular: Negative.   Gastrointestinal: Negative.   Endocrine: Negative.   Genitourinary: Negative.   Musculoskeletal: Positive for arthralgias, back pain, myalgias and neck pain. Negative for gait problem, joint swelling and neck stiffness.  Skin: Positive for rash. Negative for color change and pallor.  Neurological: Negative.   Hematological: Negative.   Psychiatric/Behavioral: Negative.        Objective:   Physical Exam  Constitutional: She is oriented to person, place, and time. She appears well-developed and well-nourished.  HENT:  Head: Normocephalic and atraumatic.  Right Ear: External ear normal.  Left Ear: External ear normal.  Mouth/Throat: Oropharynx is clear and moist.  + TMJ pain, + tenderness bilateral traps  Eyes: Conjunctivae and EOM are normal. Pupils are equal, round, and reactive to light.  Neck: Normal range of motion. Neck supple. No thyromegaly present.  Cardiovascular: Normal rate, regular rhythm and normal heart sounds.  Exam reveals no gallop and no friction rub.   No murmur  heard. Pulmonary/Chest: Effort normal and breath sounds normal. No respiratory distress. She has no wheezes.  Abdominal: Soft. Bowel sounds are normal. She exhibits no distension and no mass. There is no tenderness. There is no rebound and no guarding.  Musculoskeletal: Normal range of motion.  Lymphadenopathy:    She has no cervical adenopathy.  Neurological: She is alert and oriented to person, place, and time. She displays normal reflexes. No cranial nerve deficit. Coordination normal.  Skin: Skin is warm and dry.  Psychiatric: She has a normal mood and affect.      Assessment & Plan:  Unspecified vitamin D deficiency - Plan: Vit D  25 hydroxy (rtn osteoporosis monitoring)  Low magnesium levels - Plan: Magnesium  Neck pain - Plan: DG Cervical Spine Complete, Ambulatory referral to Physical Therapy  Back pain - Plan: HLA-B27 antigen, DG Lumbar Spine Complete, Ambulatory referral to Physical Therapy  TMJ- information given to the patient, no gum/decrease hard foods, warm wet wash clothes, decrease stress, talk with dentist about possible night guard, can do massage, and exercise.  Ambulatory referral to Physical Therapy, flexeril  Other malaise and fatigue - Plan: CBC with Differential, BASIC METABOLIC PANEL WITH GFR, Hepatic function panel, TSH, ? If component of Fibromyalgia, follow up in one month.

## 2013-10-23 NOTE — Patient Instructions (Signed)
What is the TMJ? The temporomandibular (tem-PUH-ro-man-DIB-yoo-ler) joint, or the TMJ, connects the upper and lower jawbones. This joint allows the jaw to open wide and move back and forth when you chew, talk, or yawn.There are also several muscles that help this joint move. There can be muscle tightness and pain in the muscle that can cause several symptoms.  What causes TMJ pain? There are many causes of TMJ pain. Repeated chewing (for example, chewing gum) and clenching your teeth can cause pain in the joint. Some TMJ pain has no obvious cause. What can I do to ease the pain? There are many things you can do to help your pain get better. When you have pain:  Eat soft foods and stay away from chewy foods (for example, taffy) Try to use both sides of your mouth to chew Don't chew gum Don't open your mouth wide (for example, during yawning or singing) Don't bite your cheeks or fingernails Lower your amount of stress and worry Applying a warm, damp washcloth to the joint may help. Over-the-counter pain medicines such as ibuprofen (one brand: Advil) or acetaminophen (one brand: Tylenol) might also help. Do not use these medicines if you are allergic to them or if your doctor told you not to use them. How can I stop the pain from coming back? When your pain is better, you can do these exercises to make your muscles stronger and to keep the pain from coming back:  Resisted mouth opening: Place your thumb or two fingers under your chin and open your mouth slowly, pushing up lightly on your chin with your thumb. Hold for three to six seconds. Close your mouth slowly. Resisted mouth closing: Place your thumbs under your chin and your two index fingers on the ridge between your mouth and the bottom of your chin. Push down lightly on your chin as you close your mouth. Tongue up: Slowly open and close your mouth while keeping the tongue touching the roof of the mouth. Side-to-side jaw movement: Place an  object about one fourth of an inch thick (for example, two tongue depressors) between your front teeth. Slowly move your jaw from side to side. Increase the thickness of the object as the exercise becomes easier Forward jaw movement: Place an object about one fourth of an inch thick between your front teeth and move the bottom jaw forward so that the bottom teeth are in front of the top teeth. Increase the thickness of the object as the exercise becomes easier. These exercises should not be painful. If it hurts to do these exercises, stop doing them and talk to your family doctor.   Fibromyalgia Fibromyalgia is a disorder that is often misunderstood. It is associated with muscular pains and tenderness that comes and goes. It is often associated with fatigue and sleep disturbances. Though it tends to be long-lasting, fibromyalgia is not life-threatening. CAUSES  The exact cause of fibromyalgia is unknown. People with certain gene types are predisposed to developing fibromyalgia and other conditions. Certain factors can play a role as triggers, such as:  Spine disorders.  Arthritis.  Severe injury (trauma) and other physical stressors.  Emotional stressors. SYMPTOMS   The main symptom is pain and stiffness in the muscles and joints, which can vary over time.  Sleep and fatigue problems. Other related symptoms may include:  Bowel and bladder problems.  Headaches.  Visual problems.  Problems with odors and noises.  Depression or mood changes.  Painful periods (dysmenorrhea).  Dryness of  the skin or eyes. DIAGNOSIS  There are no specific tests for diagnosing fibromyalgia. Patients can be diagnosed accurately from the specific symptoms they have. The diagnosis is made by determining that nothing else is causing the problems. TREATMENT  There is no cure. Management includes medicines and an active, healthy lifestyle. The goal is to enhance physical fitness, decrease pain, and improve  sleep. HOME CARE INSTRUCTIONS   Only take over-the-counter or prescription medicines as directed by your caregiver. Sleeping pills, tranquilizers, and pain medicines may make your problems worse.  Low-impact aerobic exercise is very important and advised for treatment. At first, it may seem to make pain worse. Gradually increasing your tolerance will overcome this feeling.  Learning relaxation techniques and how to control stress will help you. Biofeedback, visual imagery, hypnosis, muscle relaxation, yoga, and meditation are all options.  Anti-inflammatory medicines and physical therapy may provide short-term help.  Acupuncture or massage treatments may help.  Take muscle relaxant medicines as suggested by your caregiver.  Avoid stressful situations.  Plan a healthy lifestyle. This includes your diet, sleep, rest, exercise, and friends.  Find and practice a hobby you enjoy.  Join a fibromyalgia support group for interaction, ideas, and sharing advice. This may be helpful. SEEK MEDICAL CARE IF:  You are not having good results or improvement from your treatment. FOR MORE INFORMATION  National Fibromyalgia Association: www.fmaware.[place]: www.arthritis.org Document Released: 08/06/2005 Document Revised: 10/29/2011 Document Reviewed: 11/16/2009 [facility] Patient Information 2014 [place], [place].

## 2013-10-24 LAB — TSH: TSH: 1.811 u[IU]/mL (ref 0.350–4.500)

## 2013-10-24 LAB — VITAMIN D 25 HYDROXY (VIT D DEFICIENCY, FRACTURES): Vit D, 25-Hydroxy: 42 ng/mL (ref 30–89)

## 2013-10-26 LAB — HLA-B27 ANTIGEN: DNA Result:: NOT DETECTED

## 2013-11-13 ENCOUNTER — Other Ambulatory Visit: Payer: Self-pay | Admitting: Physician Assistant

## 2013-11-13 MED ORDER — HYOSCYAMINE SULFATE 0.125 MG SL SUBL: 0.1250 mg | SUBLINGUAL_TABLET | SUBLINGUAL | Status: DC | PRN

## 2013-11-13 MED ORDER — PROMETHAZINE HCL 25 MG PO TABS: 25.0000 mg | ORAL_TABLET | Freq: Four times a day (QID) | ORAL | Status: DC | PRN

## 2013-12-19 ENCOUNTER — Other Ambulatory Visit: Payer: Self-pay | Admitting: Internal Medicine

## 2013-12-19 MED ORDER — AZITHROMYCIN 250 MG PO TABS: ORAL_TABLET | ORAL | Status: DC

## 2014-04-26 DIAGNOSIS — F32A Depression, unspecified: Secondary | ICD-10-CM | POA: Insufficient documentation

## 2014-04-26 DIAGNOSIS — D649 Anemia, unspecified: Secondary | ICD-10-CM | POA: Insufficient documentation

## 2014-04-26 DIAGNOSIS — F419 Anxiety disorder, unspecified: Secondary | ICD-10-CM | POA: Insufficient documentation

## 2014-04-26 DIAGNOSIS — F329 Major depressive disorder, single episode, unspecified: Secondary | ICD-10-CM | POA: Insufficient documentation

## 2014-04-28 ENCOUNTER — Encounter: Payer: Self-pay | Admitting: Physician Assistant

## 2014-04-28 ENCOUNTER — Ambulatory Visit (INDEPENDENT_AMBULATORY_CARE_PROVIDER_SITE_OTHER): Payer: 59 | Admitting: Physician Assistant

## 2014-04-28 VITALS — BP 100/60 | HR 76 | Temp 98.2°F | Resp 16 | Ht 62.0 in | Wt 134.0 lb

## 2014-04-28 DIAGNOSIS — Z0000 Encounter for general adult medical examination without abnormal findings: Secondary | ICD-10-CM

## 2014-04-28 DIAGNOSIS — K2100 Gastro-esophageal reflux disease with esophagitis, without bleeding: Secondary | ICD-10-CM

## 2014-04-28 DIAGNOSIS — F411 Generalized anxiety disorder: Secondary | ICD-10-CM

## 2014-04-28 LAB — CBC WITH DIFFERENTIAL/PLATELET
Basophils Absolute: 0 10*3/uL (ref 0.0–0.1)
Basophils Relative: 0 % (ref 0–1)
Eosinophils Absolute: 0.1 10*3/uL (ref 0.0–0.7)
Eosinophils Relative: 1 % (ref 0–5)
HEMATOCRIT: 37.3 % (ref 36.0–46.0)
Hemoglobin: 12.5 g/dL (ref 12.0–15.0)
LYMPHS ABS: 1.9 10*3/uL (ref 0.7–4.0)
LYMPHS PCT: 27 % (ref 12–46)
MCH: 28.9 pg (ref 26.0–34.0)
MCHC: 33.5 g/dL (ref 30.0–36.0)
MCV: 86.1 fL (ref 78.0–100.0)
Monocytes Absolute: 0.6 10*3/uL (ref 0.1–1.0)
Monocytes Relative: 8 % (ref 3–12)
Neutro Abs: 4.4 10*3/uL (ref 1.7–7.7)
Neutrophils Relative %: 64 % (ref 43–77)
Platelets: 221 10*3/uL (ref 150–400)
RBC: 4.33 MIL/uL (ref 3.87–5.11)
RDW: 13.1 % (ref 11.5–15.5)
WBC: 6.9 10*3/uL (ref 4.0–10.5)

## 2014-04-28 LAB — HEMOGLOBIN A1C
HEMOGLOBIN A1C: 5.5 % (ref <5.7)
Mean Plasma Glucose: 111 mg/dL (ref <117)

## 2014-04-28 MED ORDER — BENZOYL PEROXIDE-ERYTHROMYCIN 5-3 % EX GEL: Freq: Two times a day (BID) | CUTANEOUS | Status: DC

## 2014-04-28 MED ORDER — ALPRAZOLAM 0.5 MG PO TABS: 0.5000 mg | ORAL_TABLET | Freq: Three times a day (TID) | ORAL | Status: DC | PRN

## 2014-04-28 NOTE — Patient Instructions (Signed)
Take the nexium/protonix for 2-4 more weeks once a day, then switch to pepcid or zantac (generic is fine) 2 x a day for 2 weeks, then once a day for 2 weeks and then stop. Avoid alcohol, spicy foods, NSAIDS (aleve, ibuprofen) at this time. See foods below.   Food Choices for Gastroesophageal Reflux Disease When you have gastroesophageal reflux disease (GERD), the foods you eat and your eating habits are very important. Choosing the right foods can help ease the discomfort of GERD. WHAT GENERAL GUIDELINES DO I NEED TO FOLLOW?  Choose fruits, vegetables, whole grains, low-fat dairy products, and low-fat meat, fish, and poultry.  Limit fats such as oils, salad dressings, butter, nuts, and avocado.  Keep a food diary to identify foods that cause symptoms.  Avoid foods that cause reflux. These may be different for different people.  Eat frequent small meals instead of three large meals each day.  Eat your meals slowly, in a relaxed setting.  Limit fried foods.  Cook foods using methods other than frying.  Avoid drinking alcohol.  Avoid drinking large amounts of liquids with your meals.  Avoid bending over or lying down until 2-3 hours after eating. WHAT FOODS ARE NOT RECOMMENDED? The following are some foods and drinks that may worsen your symptoms: Vegetables Tomatoes. Tomato juice. Tomato and spaghetti sauce. Chili peppers. Onion and garlic. Horseradish. Fruits Oranges, grapefruit, and lemon (fruit and juice). Meats High-fat meats, fish, and poultry. This includes hot dogs, ribs, ham, sausage, salami, and bacon. Dairy Whole milk and chocolate milk. Sour cream. Cream. Butter. Ice cream. Cream cheese.  Beverages Coffee and tea, with or without caffeine. Carbonated beverages or energy drinks. Condiments Hot sauce. Barbecue sauce.  Sweets/Desserts Chocolate and cocoa. Donuts. Peppermint and spearmint. Fats and Oils High-fat foods, including French fries and potato  chips. Other Vinegar. Strong spices, such as black pepper, white pepper, red pepper, cayenne, curry powder, cloves, ginger, and chili powder.  

## 2014-04-28 NOTE — Progress Notes (Signed)
Complete Physical  Assessment and Plan: GERD- check Hyplori- given prilosec samples Depression/Anxiety-  stress management techniques discussed, increase water, good sleep hygiene discussed, increase exercise, and increase veggies. - xanax 0.5 #30 Back pain- continue exercise, RICE Health Maintenance  Discussed med's effects and SE's. Screening labs and tests as requested with regular follow-up as recommended.  HPI 23 y.o. female  presents for a complete physical.  Her blood pressure has been controlled at home, today their BP is BP: 100/60 mmHg She does workout. She denies chest pain, shortness of breath, dizziness.  Patient is on Vitamin D supplement.   Lab Results  Component Value Date   VD25OH 42 10/23/2013  She is doing Counseling Krystal Eaton, and doing family counseling as well.    She states that she continues to have back pain, she has a 50 1/2 year old daughter and has recently separated from her husband so she is unable to find time to go to the PT.  She has been on Lexapro, wellbutrin, zoloft, and celexa in the past which she did not tolerate. Still some fatigue/depression/and bad anxiety issues.  She has had GERD.   Current Medications:  Current Outpatient Prescriptions on File Prior to Visit  Medication Sig Dispense Refill  . calcium carbonate (TUMS - DOSED IN MG ELEMENTAL CALCIUM) 500 MG chewable tablet Chew 1 tablet by mouth as needed. heartburn      . cyclobenzaprine (FLEXERIL) 10 MG tablet Take 1 tablet (10 mg total) by mouth 2 (two) times daily as needed for muscle spasms.  60 tablet  1  . ibuprofen (ADVIL,MOTRIN) 600 MG tablet Take 1 tablet (600 mg total) by mouth every 6 (six) hours as needed for pain.  30 tablet  0   No current facility-administered medications on file prior to visit.   Health Maintenance:   Immunization History  Administered Date(s) Administered  . Influenza Split 08/30/2012  . MMR 08/31/2012  . Rho (D) Immune Globulin 08/30/2012  . Tdap  08/30/2012   Tetanus: 2014 Pneumovax: Flu vaccine: 2014 Zostavax: Pap: OB GYN MGM: DEXA: Colonoscopy: EGD:  Allergies: No Known Allergies Medical History:  Past Medical History  Diagnosis Date  . No pertinent past medical history   . Anxiety   . Depression   . Anemia    Surgical History:  Past Surgical History  Procedure Laterality Date  . Eye surgery      for lazy eye  . Wisdom tooth extraction     Family History:  Family History  Problem Relation Age of Onset  . Other Neg Hx   . Diabetes Father   . Heart attack Father   . COPD Maternal Grandmother   . Cancer Maternal Grandmother     bone  . Stroke Maternal Grandfather    Social History:  History  Substance Use Topics  . Smoking status: Never Smoker   . Smokeless tobacco: Never Used  . Alcohol Use: 0.0 - 0.5 oz/week    0-1 drink(s) per week    Review of Systems: [X] = complains of  [ ] = denies  General: Fatigue [ ] Fever [ ] Chills [ ] Weakness [ ]  Insomnia [ ]Weight change [ ] Night sweats [ ]  Change in appetite [ ] Eyes: Redness [ ] Blurred vision [ ] Diplopia [ ] Discharge [ ]  ENT: Congestion [ ] Sinus Pain [ ] Post Nasal Drip [ ] Sore Throat [ ] Earache [ ] hearing loss [ ] Tinnitus [ ]  Snoring [ ]  Cardiac: Chest pain/pressure [ ] SOB [ ] Orthopnea [ ]  Palpitations [ ]  Paroxysmal nocturnal dyspnea[ ] Claudication [ ] Edema [ ]  Pulmonary: Cough [ ] Wheezing[ ]  SOB [ ]  Pleurisy [ ]  GI: Nausea [ ] Vomiting[ ] Dysphagia[ ] Heartburn[x ] Abdominal pain [ ] Constipation [ ]; Diarrhea [ ] BRBPR [ ] Melena[ ] Bloating [ ] Hemorrhoids [ ]  GU: Hematuria[ ] Dysuria [ ] Nocturia[ ] Urgency [ ]  Hesitancy [ ] Discharge [ ] Frequency [ ]  Breast:  Breast lumps [ ]  nipple discharge [ ]   Neuro: Headaches[ ] Vertigo[ ] Paresthesias[ ] Spasm [ ] Speech changes [ ] Incoordination [ ]  Ortho: Arthritis [x ] Joint pain [ ] Muscle pain [ x] Joint swelling [ ] Back Pain [ x] Skin:  Rash [ ]  Pruritis [ ] Change  in skin lesion [ ]  Psych: Depression no SI/HI [x ] Anxiety[x ] Confusion [ ] Memory loss [ ]  Heme/Lypmh: Bleeding [ ] Bruising [ ] Enlarged lymph nodes [ ]  Endocrine: Visual blurring [ ] Paresthesia [ ] Polyuria [ ] Polydypsea [ ]   Heat/cold intolerance [ ] Hypoglycemia [ ]  Physical Exam: Estimated body mass index is 24.5 kg/(m^2) as calculated from the following:   Height as of this encounter: 5' 2" (1.575 m).   Weight as of this encounter: 134 lb (60.782 kg). BP 100/60  Pulse 76  Temp(Src) 98.2 F (36.8 C)  Resp 16  Ht 5' 2" (1.575 m)  Wt 134 lb (60.782 kg)  BMI 24.50 kg/m2  LMP 04/14/2014  Breastfeeding? No General Appearance: Well nourished, in no apparent distress. Eyes: PERRLA, EOMs, conjunctiva no swelling or erythema, normal fundi and vessels. Sinuses: No Frontal/maxillary tenderness ENT/Mouth: Ext aud canals clear, normal light reflex with TMs without erythema, bulging.  Good dentition. No erythema, swelling, or exudate on post pharynx. Tonsils not swollen or erythematous. Hearing normal.  Neck: Supple, thyroid normal. No bruits Respiratory: Respiratory effort normal, BS equal bilaterally without rales, rhonchi, wheezing or stridor. Cardio: RRR without murmurs, rubs or gallops. Brisk peripheral pulses without edema.  Chest: symmetric, with normal excursions and percussion. Breasts: Symmetric, without lumps, nipple discharge, retractions. Abdomen: Soft, +BS. Non tender, no guarding, rebound, hernias, masses, or organomegaly. .  Lymphatics: Non tender without lymphadenopathy.  Genitourinary: defer Musculoskeletal: Full ROM all peripheral extremities,5/5 strength, and normal gait. Skin: Warm, dry without rashes, lesions, ecchymosis.  Neuro: Cranial nerves intact, reflexes equal bilaterally. Normal muscle tone, no cerebellar symptoms. Sensation intact.  Psych: Awake and oriented X 3, normal affect, Insight and Judgment appropriate.    Vicie Mutters 9:18 AM

## 2014-04-29 LAB — URINALYSIS, ROUTINE W REFLEX MICROSCOPIC
Bilirubin Urine: NEGATIVE
Glucose, UA: NEGATIVE mg/dL
HGB URINE DIPSTICK: NEGATIVE
KETONES UR: NEGATIVE mg/dL
Leukocytes, UA: NEGATIVE
Nitrite: NEGATIVE
Protein, ur: NEGATIVE mg/dL
Specific Gravity, Urine: 1.025 (ref 1.005–1.030)
Urobilinogen, UA: 0.2 mg/dL (ref 0.0–1.0)
pH: 5.5 (ref 5.0–8.0)

## 2014-04-29 LAB — HEPATIC FUNCTION PANEL
ALBUMIN: 4.5 g/dL (ref 3.5–5.2)
ALK PHOS: 36 U/L — AB (ref 39–117)
ALT: 9 U/L (ref 0–35)
AST: 12 U/L (ref 0–37)
Bilirubin, Direct: 0.2 mg/dL (ref 0.0–0.3)
Indirect Bilirubin: 0.5 mg/dL (ref 0.2–1.2)
TOTAL PROTEIN: 7 g/dL (ref 6.0–8.3)
Total Bilirubin: 0.7 mg/dL (ref 0.2–1.2)

## 2014-04-29 LAB — LIPID PANEL
Cholesterol: 137 mg/dL (ref 0–200)
HDL: 58 mg/dL (ref >39)
LDL Cholesterol: 71 mg/dL (ref 0–99)
Total CHOL/HDL Ratio: 2.4 Ratio
Triglycerides: 42 mg/dL (ref <150)
VLDL: 8 mg/dL (ref 0–40)

## 2014-04-29 LAB — VITAMIN D 25 HYDROXY (VIT D DEFICIENCY, FRACTURES): VIT D 25 HYDROXY: 55 ng/mL (ref 30–89)

## 2014-04-29 LAB — BASIC METABOLIC PANEL WITH GFR
BUN: 14 mg/dL (ref 6–23)
CHLORIDE: 106 meq/L (ref 96–112)
CO2: 26 meq/L (ref 19–32)
CREATININE: 0.59 mg/dL (ref 0.50–1.10)
Calcium: 9.1 mg/dL (ref 8.4–10.5)
GFR, Est African American: >89 mL/min
GFR, Est Non African American: >89 mL/min
GLUCOSE: 84 mg/dL (ref 70–99)
Potassium: 4.1 mEq/L (ref 3.5–5.3)
Sodium: 137 mEq/L (ref 135–145)

## 2014-04-29 LAB — IRON AND TIBC
%SAT: 35 % (ref 20–55)
IRON: 109 ug/dL (ref 42–145)
TIBC: 310 ug/dL (ref 250–470)
UIBC: 201 ug/dL (ref 125–400)

## 2014-04-29 LAB — TSH: TSH: 1.573 u[IU]/mL (ref 0.350–4.500)

## 2014-04-29 LAB — VITAMIN B12: Vitamin B-12: 1202 pg/mL — ABNORMAL HIGH (ref 211–911)

## 2014-04-29 LAB — MAGNESIUM: Magnesium: 1.9 mg/dL (ref 1.5–2.5)

## 2014-04-29 LAB — HELICOBACTER PYLORI ABS-IGG+IGA, BLD
H Pylori IgG: <0.4 {ISR}
HELICOBACTER PYLORI AB, IGA: 0 U/mL (ref <9.0)

## 2014-04-29 LAB — MICROALBUMIN / CREATININE URINE RATIO
Creatinine, Urine: 142.8 mg/dL
MICROALB/CREAT RATIO: 3.5 mg/g (ref 0.0–30.0)
Microalb, Ur: 0.5 mg/dL (ref 0.00–1.89)

## 2014-04-29 LAB — FERRITIN: Ferritin: 34 ng/mL (ref 10–291)

## 2014-04-29 LAB — INSULIN, FASTING: Insulin fasting, serum: 3.7 u[IU]/mL (ref 2.0–19.6)

## 2014-05-18 ENCOUNTER — Telehealth: Payer: Self-pay

## 2014-05-18 ENCOUNTER — Other Ambulatory Visit: Payer: Self-pay | Admitting: Physician Assistant

## 2014-05-18 MED ORDER — AZITHROMYCIN 250 MG PO TABS: ORAL_TABLET | ORAL | Status: AC

## 2014-05-18 NOTE — Telephone Encounter (Signed)
Received paper note from front office staff, patient requesting antibiotic for "swollen tonsils" , per Vicie Mutters, PA , [facility] for patient and advised that ZRX was sent in, if she does not get better or if symptoms get worse, she will need to follow up in office, urgent care or ER if we are not available

## 2014-06-21 ENCOUNTER — Encounter: Payer: Self-pay | Admitting: Physician Assistant

## 2014-08-16 ENCOUNTER — Encounter: Payer: Self-pay | Admitting: Internal Medicine

## 2014-08-16 ENCOUNTER — Ambulatory Visit: Payer: Self-pay | Admitting: Physician Assistant

## 2014-10-19 ENCOUNTER — Ambulatory Visit (INDEPENDENT_AMBULATORY_CARE_PROVIDER_SITE_OTHER): Payer: 59 | Admitting: Certified Nurse Midwife

## 2014-10-19 ENCOUNTER — Encounter: Payer: Self-pay | Admitting: Certified Nurse Midwife

## 2014-10-19 VITALS — BP 94/60 | HR 68 | Resp 16 | Ht 62.25 in | Wt 135.0 lb

## 2014-10-19 DIAGNOSIS — Z01419 Encounter for gynecological examination (general) (routine) without abnormal findings: Secondary | ICD-10-CM

## 2014-10-19 DIAGNOSIS — Z0000 Encounter for general adult medical examination without abnormal findings: Secondary | ICD-10-CM

## 2014-10-19 NOTE — Progress Notes (Signed)
24 y.o. G1P1001 Single  Caucasian Fe here for annual exam. Periods better with Nexplanon, with shorter periods and less cramping, no issues. Due for  removal  2/17. Partner change would like STD screens. Sees PCP for medication management of Xanax for anxiety an depression. Due for appointment per patient. No other health issues today.   Patient's last menstrual period was 10/09/2014.          Sexually active: Yes.    The current method of family planning is nexplanon.    Exercising: Yes.    cardio,strength training Smoker:  no  Health Maintenance: Pap: 10-14-13 neg MMG:  none Colonoscopy:  none BMD:   none TDaP:  08-30-12 Labs: none Self breast exam: done occ   reports that she has never smoked. She has never used smokeless tobacco. She reports that she drinks about 1.8 - 2.4 oz of alcohol per week. She reports that she does not use illicit drugs.  Past Medical History  Diagnosis Date  . No pertinent past medical history   . Anxiety   . Depression   . Anemia     Past Surgical History  Procedure Laterality Date  . Eye surgery      for lazy eye  . Wisdom tooth extraction      Current Outpatient Prescriptions  Medication Sig Dispense Refill  . calcium carbonate (TUMS - DOSED IN MG ELEMENTAL CALCIUM) 500 MG chewable tablet Chew 1 tablet by mouth as needed. heartburn    . ibuprofen (ADVIL,MOTRIN) 600 MG tablet Take 1 tablet (600 mg total) by mouth every 6 (six) hours as needed for pain. 30 tablet 0  . ALPRAZolam (XANAX) 0.5 MG tablet Take 1 tablet (0.5 mg total) by mouth 3 (three) times daily as needed for sleep or anxiety. (Patient not taking: Reported on 10/19/2014) 30 tablet 0  . cyclobenzaprine (FLEXERIL) 10 MG tablet Take 1 tablet (10 mg total) by mouth 2 (two) times daily as needed for muscle spasms. (Patient not taking: Reported on 10/19/2014) 60 tablet 1   No current facility-administered medications for this visit.    Family History  Problem Relation Age of Onset  .  Other Neg Hx   . Diabetes Father   . Heart attack Father   . COPD Maternal Grandmother   . Cancer Maternal Grandmother     bone  . Stroke Maternal Grandfather     ROS:  Pertinent items are noted in HPI.  Otherwise, a comprehensive ROS was negative.  Exam:   BP 94/60 mmHg  Pulse 68  Resp 16  Ht 5' 2.25" (1.581 m)  Wt 135 lb (61.236 kg)  BMI 24.50 kg/m2  LMP 10/09/2014 Height: 5' 2.25" (158.1 cm) Ht Readings from Last 3 Encounters:  10/19/14 5' 2.25" (1.581 m)  04/28/14 5\' 2"  (1.575 m)  10/23/13 5\' 2"  (1.575 m)    General appearance: alert, cooperative and appears stated age Head: Normocephalic, without obvious abnormality, atraumatic Neck: no adenopathy, supple, symmetrical, trachea midline and thyroid normal to inspection and palpation Lungs: clear to auscultation bilaterally Breasts: normal appearance, no masses or tenderness, No nipple retraction or dimpling, No nipple discharge or bleeding, No axillary or supraclavicular adenopathy Heart: regular rate and rhythm Abdomen: soft, non-tender; no masses,  no organomegaly Extremities: extremities normal, atraumatic, no cyanosis or edema, Nexplanon palpated in left arm intact Skin: Skin color, texture, turgor normal. No rashes or lesions Lymph nodes: Cervical, supraclavicular, and axillary nodes normal. No abnormal inguinal nodes palpated Neurologic: Grossly normal  Pelvic: External genitalia:  no lesions              Urethra:  normal appearing urethra with no masses, tenderness or lesions              Bartholin's and Skene's: normal                 Vagina: normal appearing vagina with normal color and discharge, no lesions              Cervix: normal,non tender, no lesion              Pap taken: Yes.   Bimanual Exam:  Uterus:  normal size, contour, position, consistency, mobility, non-tender              Adnexa: normal adnexa and no mass, fullness, tenderness               Rectovaginal: Confirms               Anus:   normal sphincter tone, no lesions  Chaperone present: Yes  A:  Well Woman with normal exam  Contraception Nexplanon   STD screening  Anxiety/depression with PCP management  P:   Reviewed health and wellness pertinent to exam  Discussed removal due next year 2/17 and can remove and reinsert new one if desired. Patient will advise when due.  Lab: Gc,Chlamydia  Continue follow up as indicated  Pap smear not taken today   counseled on breast self exam, STD prevention, HIV risk factors and prevention, adequate intake of calcium and vitamin D, diet and exercise  return annually or prn  An After Visit Summary was printed and given to the patient.

## 2014-10-19 NOTE — Patient Instructions (Signed)
General topics  Next pap or exam is  due in 1 year Take a Women's multivitamin Take 1200 mg. of calcium daily - prefer dietary If any concerns in interim to call back  Breast Self-Awareness Practicing breast self-awareness may pick up problems early, prevent significant medical complications, and possibly save your life. By practicing breast self-awareness, you can become familiar with how your breasts look and feel and if your breasts are changing. This allows you to notice changes early. It can also offer you some reassurance that your breast health is good. One way to learn what is normal for your breasts and whether your breasts are changing is to do a breast self-exam. If you find a lump or something that was not present in the past, it is best to contact your caregiver right away. Other findings that should be evaluated by your caregiver include nipple discharge, especially if it is bloody; skin changes or reddening; areas where the skin seems to be pulled in (retracted); or new lumps and bumps. Breast pain is seldom associated with cancer (malignancy), but should also be evaluated by a caregiver. BREAST SELF-EXAM The best time to examine your breasts is 5 7 days after your menstrual period is over.  ExitCare Patient Information 2013 [employer].   Exercise to Stay Healthy Exercise helps you become and stay healthy. EXERCISE IDEAS AND TIPS Choose exercises that:  You enjoy.  Fit into your day. You do not need to exercise really hard to be healthy. You can do exercises at a slow or medium level and stay healthy. You can:  Stretch before and after working out.  Try yoga, Pilates, or tai chi.  Lift weights.  Walk fast, swim, jog, run, climb stairs, bicycle, dance, or rollerskate.  Take aerobic classes. Exercises that burn about 150 calories:  Running 1  miles in 15 minutes.  Playing volleyball for 45 to 60 minutes.  Washing and waxing a car for 45 to 60  minutes.  Playing touch football for 45 minutes.  Walking 1  miles in 35 minutes.  Pushing a stroller 1  miles in 30 minutes.  Playing basketball for 30 minutes.  Raking leaves for 30 minutes.  Bicycling 5 miles in 30 minutes.  Walking 2 miles in 30 minutes.  Dancing for 30 minutes.  Shoveling snow for 15 minutes.  Swimming laps for 20 minutes.  Walking up stairs for 15 minutes.  Bicycling 4 miles in 15 minutes.  Gardening for 30 to 45 minutes.  Jumping rope for 15 minutes.  Washing windows or floors for 45 to 60 minutes. Document Released: 09/08/2010 Document Revised: 10/29/2011 Document Reviewed: 09/08/2010 ExitCare Patient Information 2013 [employer].   Other topics ( that may be useful information):    Sexually Transmitted Disease Sexually transmitted disease (STD) refers to any infection that is passed from person to person during sexual activity. This may happen by way of saliva, semen, blood, vaginal mucus, or urine. Common STDs include:  Gonorrhea.  Chlamydia.  Syphilis.  HIV/AIDS.  Genital herpes.  Hepatitis B and C.  Trichomonas.  Human papillomavirus (HPV).  Pubic lice. CAUSES  An STD may be spread by bacteria, virus, or parasite. A person can get an STD by:  Sexual intercourse with an infected person.  Sharing sex toys with an infected person.  Sharing needles with an infected person.  Having intimate contact with the genitals, mouth, or rectal areas of an infected person. SYMPTOMS  Some people may not have any symptoms, but   they can still pass the infection to others. Different STDs have different symptoms. Symptoms include:  Painful or bloody urination.  Pain in the pelvis, abdomen, vagina, anus, throat, or eyes.  Skin rash, itching, irritation, growths, or sores (lesions). These usually occur in the genital or anal area.  Abnormal vaginal discharge.  Penile discharge in men.  Soft, flesh-colored skin growths in the  genital or anal area.  Fever.  Pain or bleeding during sexual intercourse.  Swollen glands in the groin area.  Yellow skin and eyes (jaundice). This is seen with hepatitis. DIAGNOSIS  To make a diagnosis, your caregiver may:  Take a medical history.  Perform a physical exam.  Take a specimen (culture) to be examined.  Examine a sample of discharge under a microscope.  Perform blood test TREATMENT   Chlamydia, gonorrhea, trichomonas, and syphilis can be cured with antibiotic medicine.  Genital herpes, hepatitis, and HIV can be treated, but not cured, with prescribed medicines. The medicines will lessen the symptoms.  Genital warts from HPV can be treated with medicine or by freezing, burning (electrocautery), or surgery. Warts may come back.  HPV is a virus and cannot be cured with medicine or surgery.However, abnormal areas may be followed very closely by your caregiver and may be removed from the cervix, vagina, or vulva through office procedures or surgery. If your diagnosis is confirmed, your recent sexual partners need treatment. This is true even if they are symptom-free or have a negative culture or evaluation. They should not have sex until their caregiver says it is okay. HOME CARE INSTRUCTIONS  All sexual partners should be informed, tested, and treated for all STDs.  Take your antibiotics as directed. Finish them even if you start to feel better.  Only take over-the-counter or prescription medicines for pain, discomfort, or fever as directed by your caregiver.  Rest.  Eat a balanced diet and drink enough fluids to keep your urine clear or pale yellow.  Do not have sex until treatment is completed and you have followed up with your caregiver. STDs should be checked after treatment.  Keep all follow-up appointments, Pap tests, and blood tests as directed by your caregiver.  Only use latex condoms and water-soluble lubricants during sexual activity. Do not use  petroleum jelly or oils.  Avoid alcohol and illegal drugs.  Get vaccinated for HPV and hepatitis. If you have not received these vaccines in the past, talk to your caregiver about whether one or both might be right for you.  Avoid risky sex practices that can break the skin. The only way to avoid getting an STD is to avoid all sexual activity.Latex condoms and dental dams (for oral sex) will help lessen the risk of getting an STD, but will not completely eliminate the risk. SEEK MEDICAL CARE IF:   You have a fever.  You have any new or worsening symptoms. Document Released: 10/27/2002 Document Revised: 10/29/2011 Document Reviewed: 11/03/2010 [facility] Patient Information 2013 [employer].    Domestic Abuse You are being battered or abused if someone close to you hits, pushes, or physically hurts you in any way. You also are being abused if you are forced into activities. You are being sexually abused if you are forced to have sexual contact of any kind. You are being emotionally abused if you are made to feel worthless or if you are constantly threatened. It is important to remember that help is available. No one has the right to abuse you. PREVENTION OF FURTHER  ABUSE  Learn the warning signs of danger. This varies with situations but may include: the use of alcohol, threats, isolation from friends and family, or forced sexual contact. Leave if you feel that violence is going to occur.  If you are attacked or beaten, report it to the police so the abuse is documented. You do not have to press charges. The police can protect you while you or the attackers are leaving. Get the officer's name and badge number and a copy of the report.  Find someone you can trust and tell them what is happening to you: your caregiver, a nurse, clergy member, close friend or family member. Feeling ashamed is natural, but remember that you have done nothing wrong. No one deserves abuse. Document Released:  08/03/2000 Document Revised: 10/29/2011 Document Reviewed: 10/12/2010 ExitCare Patient Information 2013 [employer].    How Much is Too Much Alcohol? Drinking too much alcohol can cause injury, accidents, and health problems. These types of problems can include:   Car crashes.  Falls.  Family fighting (domestic violence).  Drowning.  Fights.  Injuries.  Burns.  Damage to certain organs.  Having a baby with birth defects. ONE DRINK CAN BE TOO MUCH WHEN YOU ARE:  Working.  Pregnant or breastfeeding.  Taking medicines. Ask your doctor.  Driving or planning to drive. If you or someone you know has a drinking problem, get help from a doctor.  Document Released: 06/02/2009 Document Revised: 10/29/2011 Document Reviewed: 06/02/2009 ExitCare Patient Information 2013 [employer].   Smoking Hazards Smoking cigarettes is extremely bad for your health. Tobacco smoke has over 200 known poisons in it. There are over 60 chemicals in tobacco smoke that cause cancer. Some of the chemicals found in cigarette smoke include:   Cyanide.  Benzene.  Formaldehyde.  Methanol (wood alcohol).  Acetylene (fuel used in welding torches).  Ammonia. Cigarette smoke also contains the poisonous gases nitrogen oxide and carbon monoxide.  Cigarette smokers have an increased risk of many serious medical problems and Smoking causes approximately:  90% of all lung cancer deaths in men.  80% of all lung cancer deaths in women.  90% of deaths from chronic obstructive lung disease. Compared with nonsmokers, smoking increases the risk of:  Coronary heart disease by 2 to 4 times.  Stroke by 2 to 4 times.  Men developing lung cancer by 23 times.  Women developing lung cancer by 13 times.  Dying from chronic obstructive lung diseases by 12 times.  . Smoking is the most preventable cause of death and disease in our society.  WHY IS SMOKING ADDICTIVE?  Nicotine is the chemical  agent in tobacco that is capable of causing addiction or dependence.  When you smoke and inhale, nicotine is absorbed rapidly into the bloodstream through your lungs. Nicotine absorbed through the lungs is capable of creating a powerful addiction. Both inhaled and non-inhaled nicotine may be addictive.  Addiction studies of cigarettes and spit tobacco show that addiction to nicotine occurs mainly during the teen years, when young people begin using tobacco products. WHAT ARE THE BENEFITS OF QUITTING?  There are many health benefits to quitting smoking.   Likelihood of developing cancer and heart disease decreases. Health improvements are seen almost immediately.  Blood pressure, pulse rate, and breathing patterns start returning to normal soon after quitting. QUITTING SMOKING   American Lung Association - 1-800-LUNGUSA  American Cancer Society - 1-800-ACS-2345 Document Released: 09/13/2004 Document Revised: 10/29/2011 Document Reviewed: 05/18/2009 ExitCare Patient Information 2013 ExitCare[employer]   LL[employer].   Stress Management Stress is a state of physical or mental tension that often results from changes in your life or normal routine. Some common causes of stress are:  Death of a loved one.  Injuries or severe illnesses.  Getting fired or changing jobs.  Moving into a new home. Other causes may be:  Sexual problems.  Business or financial losses.  Taking on a large debt.  Regular conflict with someone at home or at work.  Constant tiredness from lack of sleep. It is not just bad things that are stressful. It may be stressful to:  Win the lottery.  Get married.  Buy a new car. The amount of stress that can be easily tolerated varies from person to person. Changes generally cause stress, regardless of the types of change. Too much stress can affect your health. It may lead to physical or emotional problems. Too little stress (boredom) may also become stressful. SUGGESTIONS TO  REDUCE STRESS:  Talk things over with your family and friends. It often is helpful to share your concerns and worries. If you feel your problem is serious, you may want to get help from a professional counselor.  Consider your problems one at a time instead of lumping them all together. Trying to take care of everything at once may seem impossible. List all the things you need to do and then start with the most important one. Set a goal to accomplish 2 or 3 things each day. If you expect to do too many in a single day you will naturally fail, causing you to feel even more stressed.  Do not use alcohol or drugs to relieve stress. Although you may feel better for a short time, they do not remove the problems that caused the stress. They can also be habit forming.  Exercise regularly - at least 3 times per week. Physical exercise can help to relieve that "uptight" feeling and will relax you.  The shortest distance between despair and hope is often a good night's sleep.  Go to bed and get up on time allowing yourself time for appointments without being rushed.  Take a short "time-out" period from any stressful situation that occurs during the day. Close your eyes and take some deep breaths. Starting with the muscles in your face, tense them, hold it for a few seconds, then relax. Repeat this with the muscles in your neck, shoulders, hand, stomach, back and legs.  Take good care of yourself. Eat a balanced diet and get plenty of rest.  Schedule time for having fun. Take a break from your daily routine to relax. HOME CARE INSTRUCTIONS   Call if you feel overwhelmed by your problems and feel you can no longer manage them on your own.  Return immediately if you feel like hurting yourself or someone else. Document Released: 01/30/2001 Document Revised: 10/29/2011 Document Reviewed: 09/22/2007 ExitCare Patient Information 2013 [employer]Care[employer].  Etonogestrel implant What is this  medicine? ETONOGESTREL (et oh noe JES trel) is a contraceptive (birth control) device. It is used to prevent pregnancy. It can be used for up to 3 years. This medicine may be used for other purposes; ask your health care provider or pharmacist if you have questions. COMMON BRAND NAME(S): Implanon, Nexplanon What should I tell my health care provider before I take this medicine? They need to know if you have any of these conditions: -abnormal vaginal bleeding -blood vessel disease or blood clots -cancer of the breast, cervix, or liver -  depression -diabetes -gallbladder disease -headaches -heart disease or recent heart attack -high blood pressure -high cholesterol -kidney disease -liver disease -renal disease -seizures -tobacco smoker -an unusual or allergic reaction to etonogestrel, other hormones, anesthetics or antiseptics, medicines, foods, dyes, or preservatives -pregnant or trying to get pregnant -breast-feeding How should I use this medicine? This device is inserted just under the skin on the inner side of your upper arm by a health care professional. Talk to your pediatrician regarding the use of this medicine in children. Special care may be needed. Overdosage: If you think you've taken too much of this medicine contact a poison control center or emergency room at once. Overdosage: If you think you have taken too much of this medicine contact a poison control center or emergency room at once. NOTE: This medicine is only for you. Do not share this medicine with others. What if I miss a dose? This does not apply. What may interact with this medicine? Do not take this medicine with any of the following medications: -amprenavir -bosentan -fosamprenavir This medicine may also interact with the following medications: -barbiturate medicines for inducing sleep or treating seizures -certain medicines for fungal infections like ketoconazole and  itraconazole -griseofulvin -medicines to treat seizures like carbamazepine, felbamate, oxcarbazepine, phenytoin, topiramate -modafinil -phenylbutazone -rifampin -some medicines to treat HIV infection like atazanavir, indinavir, lopinavir, nelfinavir, tipranavir, ritonavir -St. John's wort This list may not describe all possible interactions. Give your health care provider a list of all the medicines, herbs, non-prescription drugs, or dietary supplements you use. Also tell them if you smoke, drink alcohol, or use illegal drugs. Some items may interact with your medicine. What should I watch for while using this medicine? This product does not protect you against HIV infection (AIDS) or other sexually transmitted diseases. You should be able to feel the implant by pressing your fingertips over the skin where it was inserted. Tell your doctor if you cannot feel the implant. What side effects may I notice from receiving this medicine? Side effects that you should report to your doctor or health care professional as soon as possible: -allergic reactions like skin rash, itching or hives, swelling of the face, lips, or tongue -breast lumps -changes in vision -confusion, trouble speaking or understanding -dark urine -depressed mood -general ill feeling or flu-like symptoms -light-colored stools -loss of appetite, nausea -right upper belly pain -severe headaches -severe pain, swelling, or tenderness in the abdomen -shortness of breath, chest pain, swelling in a leg -signs of pregnancy -sudden numbness or weakness of the face, arm or leg -trouble walking, dizziness, loss of balance or coordination -unusual vaginal bleeding, discharge -unusually weak or tired -yellowing of the eyes or skin Side effects that usually do not require medical attention (Report these to your doctor or health care professional if they continue or are bothersome.): -acne -breast pain -changes in  weight -cough -fever or chills -headache -irregular menstrual bleeding -itching, burning, and vaginal discharge -pain or difficulty passing urine -sore throat This list may not describe all possible side effects. Call your doctor for medical advice about side effects. You may report side effects to FDA at 1-800-FDA-1088. Where should I keep my medicine? This drug is given in a hospital or clinic and will not be stored at home. NOTE: This sheet is a summary. It may not cover all possible information. If you have questions about this medicine, talk to your doctor, pharmacist, or health care provider.  2015, [employer]. (2012-02-11 15:37:45)  

## 2014-10-21 LAB — IPS N GONORRHOEA AND CHLAMYDIA BY PCR

## 2014-10-21 IMAGING — US US OB FOLLOW-UP
1 series · 12 of 28 positions shown · non-contrast
Comparison: none

[Series 1: us ob follow-up · 0.23mm/px · 12 of 57 slices shown]
[im 3/57]
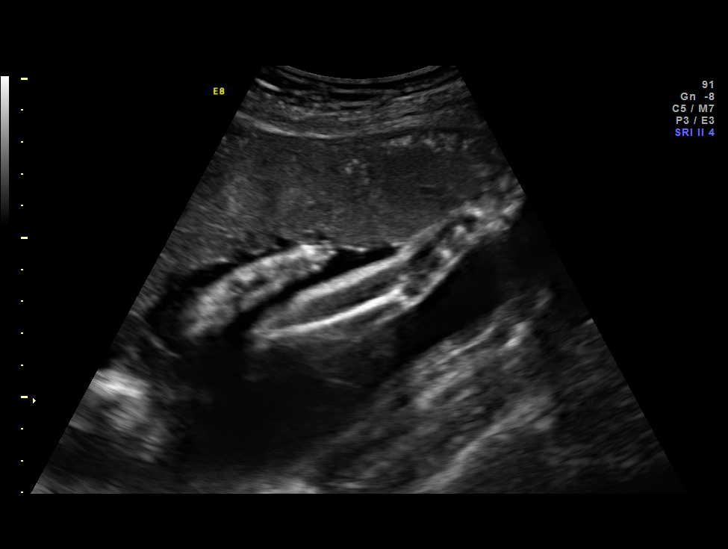
[im 7/57]
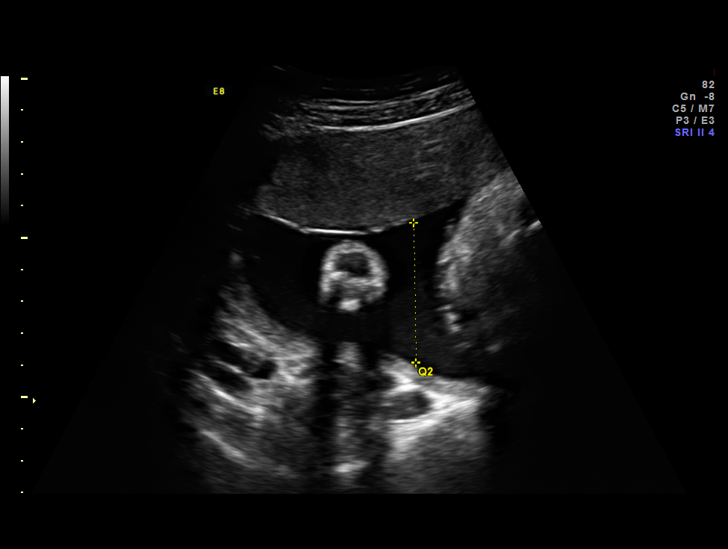
[im 11/57]
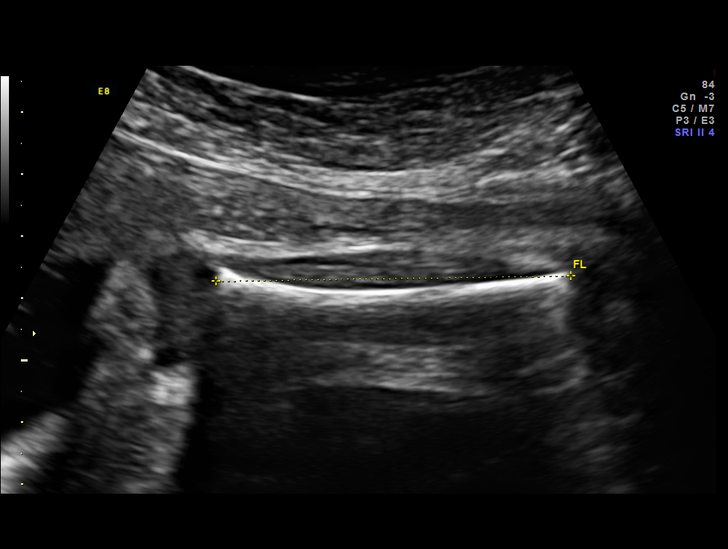
[im 17/57]
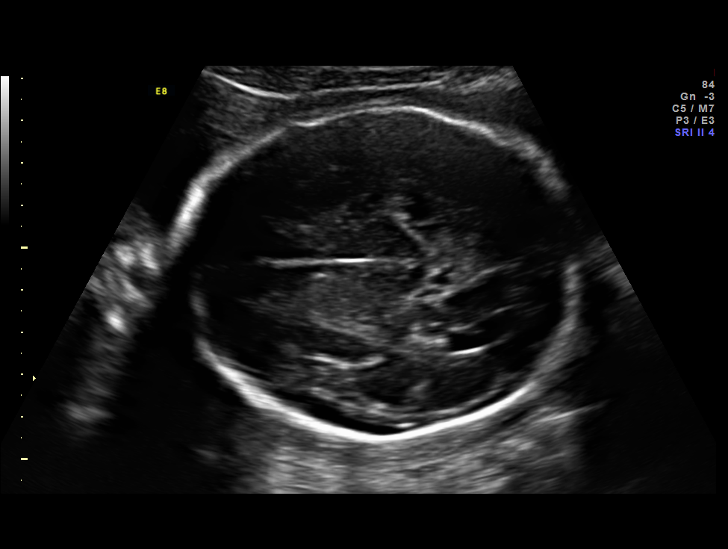
[im 21/57]
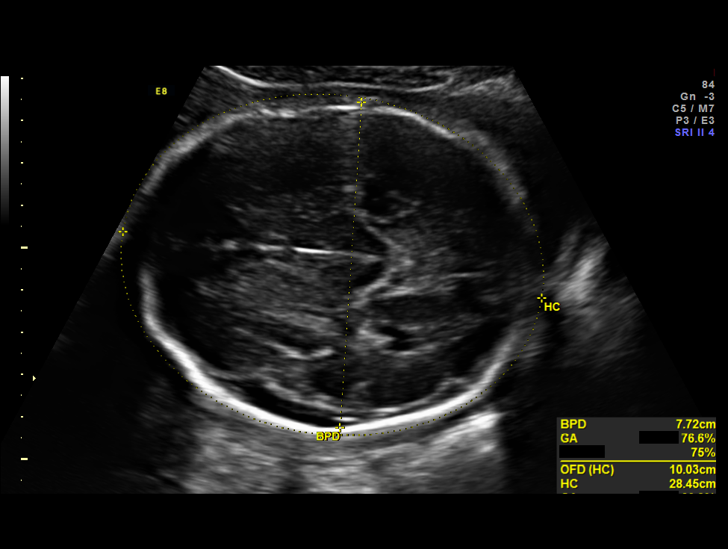
[im 25/57]
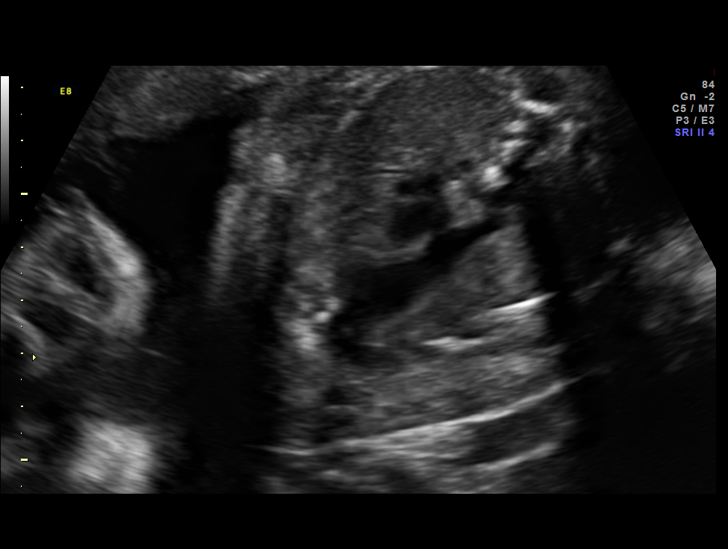
[im 32/57]
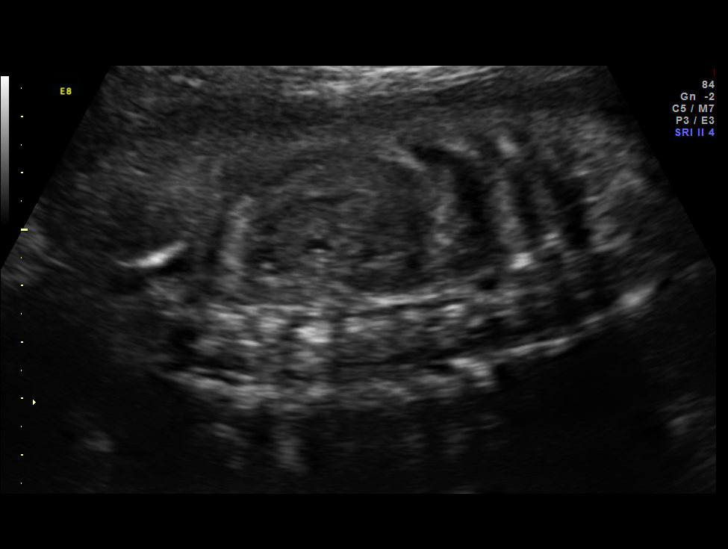
[im 36/57]
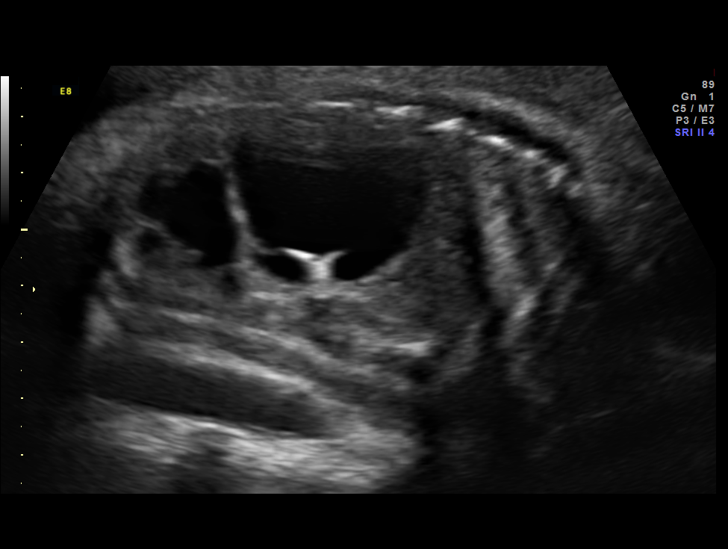
[im 40/57]
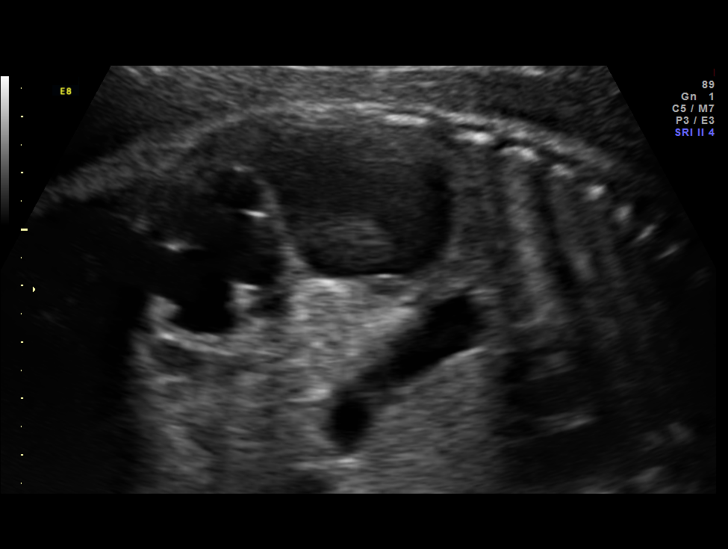
[im 46/57]
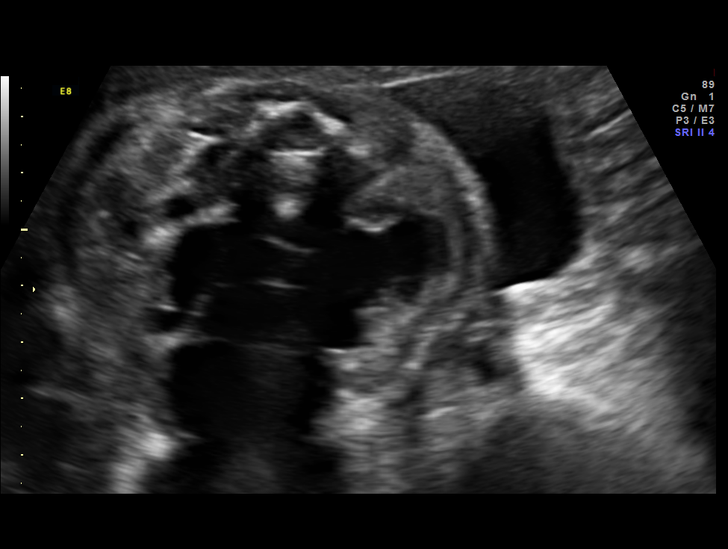
[im 50/57]
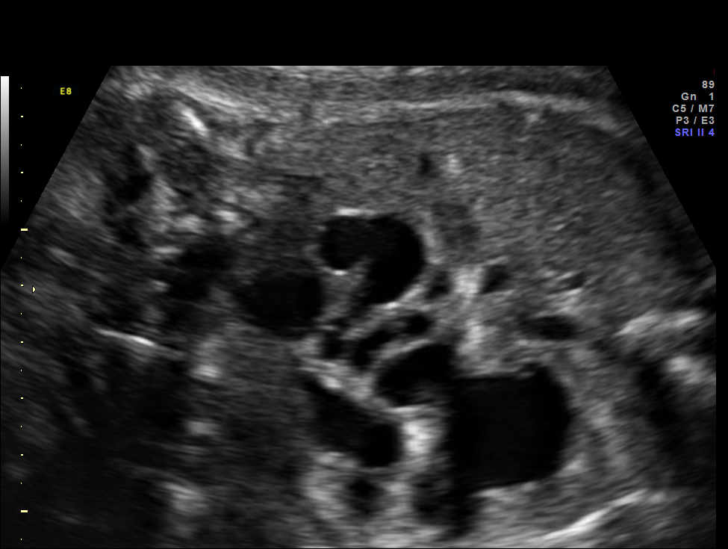
[im 54/57]
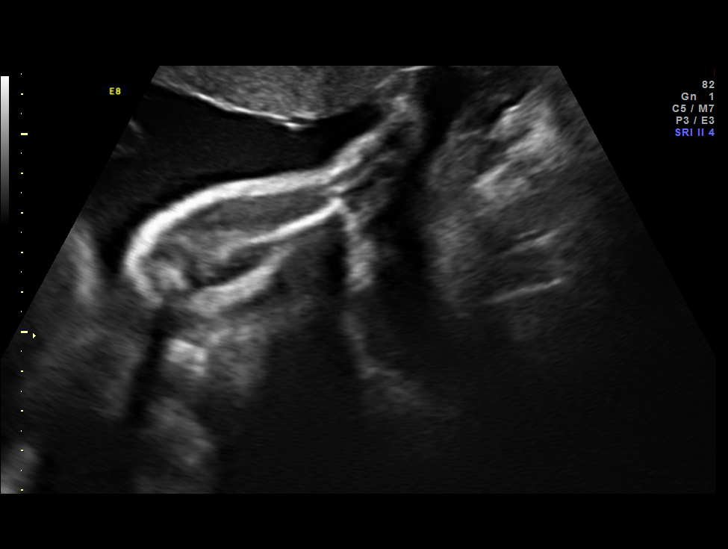

[12 of 28 positions shown; findings below may reference images not displayed]

OBSTETRICS REPORT
                      (Signed Final 07/09/2012 [DATE])

Service(s) Provided

 US OB FOLLOW UP                                       76816.1
Indications

 Fetal Hydronephrosis (left kidney)
Fetal Evaluation

 Num Of Fetuses:    1
 Fetal Heart Rate:  125                          bpm
 Cardiac Activity:  Observed
 Presentation:      Cephalic
 Placenta:          Anterior, above cervical os
 P. Cord            Previously Visualized
 Insertion:

 Amniotic Fluid
 AFI FV:      Subjectively within normal limits
 AFI Sum:     15.47   cm       55  %Tile     Larg Pckt:    5.36  cm
 RUQ:   5.36    cm   RLQ:    2.83   cm    LUQ:   4.4     cm   LLQ:    2.88   cm
Biometry

 BPD:     76.8  mm     G. Age:  30w 6d                CI:         76.7   70 - 86
 OFD:    100.1  mm                                    FL/HC:      20.5   19.2 -

 HC:     282.7  mm     G. Age:  31w 0d       53  %    HC/AC:      1.01   0.99 -

 AC:     279.3  mm     G. Age:  32w 0d       94  %    FL/BPD:     75.4   71 - 87
 FL:      57.9  mm     G. Age:  30w 2d       52  %    FL/AC:      20.7   20 - 24
 HUM:     48.8  mm     G. Age:  28w 5d       29  %

 Est. FW:    0966  gm    3 lb 13 oz      78  %
Gestational Age

 LMP:           29w 6d        Date:  12/13/11                 EDD:   09/18/12
 U/S Today:     31w 0d                                        EDD:   09/10/12
 Best:          29w 5d     Det. By:  Early Ultrasound         EDD:   09/19/12
                                     (02/11/12)
Anatomy
 Cranium:          Appears normal         Aortic Arch:      Previously seen
 Fetal Cavum:      Appears normal         Ductal Arch:      Previously seen
 Ventricles:       Appears normal         Diaphragm:        Appears normal
 Choroid Plexus:   Previously seen        Stomach:          Appears normal
 Cerebellum:       Previously seen        Abdomen:          Previously seen
 Posterior Fossa:  Previously seen        Abdominal Wall:   Previously seen
 Nuchal Fold:      Not applicable (>20    Cord Vessels:     Previously seen
                   wks GA)
 Face:             Orbits and profile     Kidneys:          Left Double
                   previously seen
                                                            collecting system
 Lips:             Previously seen        Bladder:          Appears normal
 Heart:            Previously seen        Spine:            Previously seen
 RVOT:             Previously seen        Lower             Previously seen
                                          Extremities:
 LVOT:             Previously seen        Upper             Previously seen
                                          Extremities:

 Other:  Female gender. Heels and 5th digit previously seen.
Cervix Uterus Adnexa

 Cervix:       Not visualized (advanced GA >45wks)
Impression

 IUP at 29 [DATE] weeks
 Duplicated collecting system on left with partial obstruction at
 the ureterovesicular junction- left hydroureter noted
 Grade IV left hydronephrosis; minimal renal tissue visible on
 the left which appears somewhat echogenic
 The right kidney and bladder appear normal.  No ureterocele
 is appreciated.
 Otherwise, normal interval anatomy.
 Interval growth is appropriate (78th %tile)
 Normal amniotic fluid volume

 Patient was previous seen by Peds [ESMARALDA]Nya Jumper.
Recommendations

 Recommend follow up ultrasound in 4 weeks.
 May deliver at [REDACTED] - but will likely need Peds Urology follow
 up after delivery.

## 2014-10-26 NOTE — Progress Notes (Signed)
Reviewed personally.  M. Suzanne Cesar Rogerson, MD.  

## 2014-11-17 ENCOUNTER — Encounter: Payer: Self-pay | Admitting: Physician Assistant

## 2014-11-17 ENCOUNTER — Ambulatory Visit (INDEPENDENT_AMBULATORY_CARE_PROVIDER_SITE_OTHER): Payer: 59 | Admitting: Physician Assistant

## 2014-11-17 VITALS — BP 102/62 | HR 76 | Temp 98.1°F | Resp 16 | Ht 62.0 in | Wt 138.0 lb

## 2014-11-17 DIAGNOSIS — M79671 Pain in right foot: Secondary | ICD-10-CM

## 2014-11-17 DIAGNOSIS — F4323 Adjustment disorder with mixed anxiety and depressed mood: Secondary | ICD-10-CM

## 2014-11-17 DIAGNOSIS — L700 Acne vulgaris: Secondary | ICD-10-CM

## 2014-11-17 DIAGNOSIS — M722 Plantar fascial fibromatosis: Secondary | ICD-10-CM

## 2014-11-17 DIAGNOSIS — M79672 Pain in left foot: Principal | ICD-10-CM

## 2014-11-17 MED ORDER — ALPRAZOLAM 0.5 MG PO TABS
0.5000 mg | ORAL_TABLET | Freq: Two times a day (BID) | ORAL | Status: DC | PRN
Start: 2014-11-17 — End: 2015-05-05

## 2014-11-17 MED ORDER — CLINDAMYCIN PHOSPHATE 1 % EX LOTN: TOPICAL_LOTION | Freq: Two times a day (BID) | CUTANEOUS | Status: DC

## 2014-11-17 NOTE — Patient Instructions (Addendum)
Plantar Fasciitis (Heel Spur Syndrome) with Rehab The plantar fascia is a fibrous, ligament-like, soft-tissue structure that spans the bottom of the foot. Plantar fasciitis is a condition that causes pain in the foot due to inflammation of the tissue. SYMPTOMS   Pain and tenderness on the underneath side of the foot.  Pain that worsens with standing or walking. CAUSES  Plantar fasciitis is caused by irritation and injury to the plantar fascia on the underneath side of the foot. Common mechanisms of injury include:  Direct trauma to bottom of the foot.  Damage to a small nerve that runs under the foot where the main fascia attaches to the heel bone.  Stress placed on the plantar fascia due to bone spurs. RISK INCREASES WITH:   Activities that place stress on the plantar fascia (running, jumping, pivoting, or cutting).  Poor strength and flexibility.  Improperly fitted shoes.  Tight calf muscles.  Flat feet.  Failure to warm-up properly before activity.  Obesity. PREVENTION  Warm up and stretch properly before activity.  Allow for adequate recovery between workouts.  Maintain physical fitness:  Strength, flexibility, and endurance.  Cardiovascular fitness.  Maintain a health body weight.  Avoid stress on the plantar fascia.  Wear properly fitted shoes, including arch supports for individuals who have flat feet. PROGNOSIS  If treated properly, then the symptoms of plantar fasciitis usually resolve without surgery. However, occasionally surgery is necessary. RELATED COMPLICATIONS   Recurrent symptoms that may result in a chronic condition.  Problems of the lower back that are caused by compensating for the injury, such as limping.  Pain or weakness of the foot during push-off following surgery.  Chronic inflammation, scarring, and partial or complete fascia tear, occurring more often from repeated injections. TREATMENT  Treatment initially involves the use of  ice and medication to help reduce pain and inflammation. The use of strengthening and stretching exercises may help reduce pain with activity, especially stretches of the Achilles tendon. These exercises may be performed at home or with a therapist. Your caregiver may recommend that you use heel cups of arch supports to help reduce stress on the plantar fascia. Occasionally, corticosteroid injections are given to reduce inflammation. If symptoms persist for greater than 6 months despite non-surgical (conservative), then surgery may be recommended.  MEDICATION   If pain medication is necessary, then nonsteroidal anti-inflammatory medications, such as aspirin and ibuprofen, or other minor pain relievers, such as acetaminophen, are often recommended.  Do not take pain medication within 7 days before surgery.  Prescription pain relievers may be given if deemed necessary by your caregiver. Use only as directed and only as much as you need.  Corticosteroid injections may be given by your caregiver. These injections should be reserved for the most serious cases, because they may only be given a certain number of times. HEAT AND COLD  Cold treatment (icing) relieves pain and reduces inflammation. Cold treatment should be applied for 10 to 15 minutes every 2 to 3 hours for inflammation and pain and immediately after any activity that aggravates your symptoms. Use ice packs or massage the area with a piece of ice (ice massage).  Heat treatment may be used prior to performing the stretching and strengthening activities prescribed by your caregiver, physical therapist, or athletic trainer. Use a heat pack or soak the injury in warm water. SEEK IMMEDIATE MEDICAL CARE IF:  Treatment seems to offer no benefit, or the condition worsens.  Any medications produce adverse side effects. EXERCISES RANGE   OF MOTION (ROM) AND STRETCHING EXERCISES - Plantar Fasciitis (Heel Spur Syndrome) These exercises may help you  when beginning to rehabilitate your injury. Your symptoms may resolve with or without further involvement from your physician, physical therapist or athletic trainer. While completing these exercises, remember:   Restoring tissue flexibility helps normal motion to return to the joints. This allows healthier, less painful movement and activity.  An effective stretch should be held for at least 30 seconds.  A stretch should never be painful. You should only feel a gentle lengthening or release in the stretched tissue. RANGE OF MOTION - Toe Extension, Flexion  Sit with your right / left leg crossed over your opposite knee.  Grasp your toes and gently pull them back toward the top of your foot. You should feel a stretch on the bottom of your toes and/or foot.  Hold this stretch for __________ seconds.  Now, gently pull your toes toward the bottom of your foot. You should feel a stretch on the top of your toes and or foot.  Hold this stretch for __________ seconds. Repeat __________ times. Complete this stretch __________ times per day.  RANGE OF MOTION - Ankle Dorsiflexion, Active Assisted  Remove shoes and sit on a chair that is preferably not on a carpeted surface.  Place right / left foot under knee. Extend your opposite leg for support.  Keeping your heel down, slide your right / left foot back toward the chair until you feel a stretch at your ankle or calf. If you do not feel a stretch, slide your bottom forward to the edge of the chair, while still keeping your heel down.  Hold this stretch for __________ seconds. Repeat __________ times. Complete this stretch __________ times per day.  STRETCH - Gastroc, Standing  Place hands on wall.  Extend right / left leg, keeping the front knee somewhat bent.  Slightly point your toes inward on your back foot.  Keeping your right / left heel on the floor and your knee straight, shift your weight toward the wall, not allowing your back to  arch.  You should feel a gentle stretch in the right / left calf. Hold this position for __________ seconds. Repeat __________ times. Complete this stretch __________ times per day. STRETCH - Soleus, Standing  Place hands on wall.  Extend right / left leg, keeping the other knee somewhat bent.  Slightly point your toes inward on your back foot.  Keep your right / left heel on the floor, bend your back knee, and slightly shift your weight over the back leg so that you feel a gentle stretch deep in your back calf.  Hold this position for __________ seconds. Repeat __________ times. Complete this stretch __________ times per day. STRETCH - Gastrocsoleus, Standing  Note: This exercise can place a lot of stress on your foot and ankle. Please complete this exercise only if specifically instructed by your caregiver.   Place the ball of your right / left foot on a step, keeping your other foot firmly on the same step.  Hold on to the wall or a rail for balance.  Slowly lift your other foot, allowing your body weight to press your heel down over the edge of the step.  You should feel a stretch in your right / left calf.  Hold this position for __________ seconds.  Repeat this exercise with a slight bend in your right / left knee. Repeat __________ times. Complete this stretch __________ times per day.  STRENGTHENING EXERCISES - Plantar Fasciitis (Heel Spur Syndrome)  These exercises may help you when beginning to rehabilitate your injury. They may resolve your symptoms with or without further involvement from your physician, physical therapist or athletic trainer. While completing these exercises, remember:   Muscles can gain both the endurance and the strength needed for everyday activities through controlled exercises.  Complete these exercises as instructed by your physician, physical therapist or athletic trainer. Progress the resistance and repetitions only as guided. STRENGTH -  Towel Curls  Sit in a chair positioned on a non-carpeted surface.  Place your foot on a towel, keeping your heel on the floor.  Pull the towel toward your heel by only curling your toes. Keep your heel on the floor.  If instructed by your physician, physical therapist or athletic trainer, add ____________________ at the end of the towel. Repeat __________ times. Complete this exercise __________ times per day. STRENGTH - Ankle Inversion  Secure one end of a rubber exercise band/tubing to a fixed object (table, pole). Loop the other end around your foot just before your toes.  Place your fists between your knees. This will focus your strengthening at your ankle.  Slowly, pull your big toe up and in, making sure the band/tubing is positioned to resist the entire motion.  Hold this position for __________ seconds.  Have your muscles resist the band/tubing as it slowly pulls your foot back to the starting position. Repeat __________ times. Complete this exercises __________ times per day.  Document Released: 08/06/2005 Document Revised: 10/29/2011 Document Reviewed: 11/18/2008 [facility] Patient Information 2015 [place], [place]. This information is not intended to replace advice given to you by your health care provider. Make sure you discuss any questions you have with your health care provider.  Stress and Stress Management Stress is a normal reaction to life events. It is what you feel when life demands more than you are used to or more than you can handle. Some stress can be useful. For example, the stress reaction can help you catch the last bus of the day, study for a test, or meet a deadline at work. But stress that occurs too often or for too long can cause problems. It can affect your emotional health and interfere with relationships and normal daily activities. Too much stress can weaken your immune system and increase your risk for physical illness. If you already have a medical  problem, stress can make it worse. CAUSES  All sorts of life events may cause stress. An event that causes stress for one person may not be stressful for another person. Major life events commonly cause stress. These may be positive or negative. Examples include losing your job, moving into a new home, getting married, having a baby, or losing a loved one. Less obvious life events may also cause stress, especially if they occur day after day or in combination. Examples include working long hours, driving in traffic, caring for children, being in debt, or being in a difficult relationship. SIGNS AND SYMPTOMS Stress may cause emotional symptoms including, the following:  Anxiety. This is feeling worried, afraid, on edge, overwhelmed, or out of control.  Anger. This is feeling irritated or impatient.  Depression. This is feeling sad, down, helpless, or guilty.  Difficulty focusing, remembering, or making decisions. Stress may cause physical symptoms, including the following:   Aches and pains. These may affect your head, neck, back, stomach, or other areas of your body.  Tight muscles or clenched jaw.  Low energy or trouble sleeping. Stress may cause unhealthy behaviors, including the following:   Eating to feel better (overeating) or skipping meals.  Sleeping too little, too much, or both.  Working too much or putting off tasks (procrastination).  Smoking, drinking alcohol, or using drugs to feel better. DIAGNOSIS  Stress is diagnosed through an assessment by your health care provider. Your health care provider will ask questions about your symptoms and any stressful life events.Your health care provider will also ask about your medical history and may order blood tests or other tests. Certain medical conditions and medicine can cause physical symptoms similar to stress. Mental illness can cause emotional symptoms and unhealthy behaviors similar to stress. Your health care provider may  refer you to a mental health professional for further evaluation.  TREATMENT  Stress management is the recommended treatment for stress.The goals of stress management are reducing stressful life events and coping with stress in healthy ways.  Techniques for reducing stressful life events include the following:  Stress identification. Self-monitor for stress and identify what causes stress for you. These skills may help you to avoid some stressful events.  Time management. Set your priorities, keep a calendar of events, and learn to say "no." These tools can help you avoid making too many commitments. Techniques for coping with stress include the following:  Rethinking the problem. Try to think realistically about stressful events rather than ignoring them or overreacting. Try to find the positives in a stressful situation rather than focusing on the negatives.  Exercise. Physical exercise can release both physical and emotional tension. The key is to find a form of exercise you enjoy and do it regularly.  Relaxation techniques. These relax the body and mind. Examples include yoga, meditation, tai chi, biofeedback, deep breathing, progressive muscle relaxation, listening to music, being out in nature, journaling, and other hobbies. Again, the key is to find one or more that you enjoy and can do regularly.  Healthy lifestyle. Eat a balanced diet, get plenty of sleep, and do not smoke. Avoid using alcohol or drugs to relax.  Strong support network. Spend time with family, friends, or other people you enjoy being around.Express your feelings and talk things over with someone you trust. Counseling or talktherapy with a mental health professional may be helpful if you are having difficulty managing stress on your own. Medicine is typically not recommended for the treatment of stress.Talk to your health care provider if you think you need medicine for symptoms of stress. HOME CARE  INSTRUCTIONS  Keep all follow-up visits as directed by your health care provider.  Take all medicines as directed by your health care provider. SEEK MEDICAL CARE IF:  Your symptoms get worse or you start having new symptoms.  You feel overwhelmed by your problems and can no longer manage them on your own. SEEK IMMEDIATE MEDICAL CARE IF:  You feel like hurting yourself or someone else. Document Released: 01/30/2001 Document Revised: 12/21/2013 Document Reviewed: 03/31/2013 [facility] Patient Information 2015 [place], [place]. This information is not intended to replace advice given to you by your health care provider. Make sure you discuss any questions you have with your health care provider.

## 2014-11-17 NOTE — Progress Notes (Signed)
Assessment and Plan: 1. Pain in both feet + plantar faciitis, flat feet, ? Edema as well- get compression stockings Conservative treatment, night time orthotics, arch support, RICE, NSAID, stretches given  2. Acne cystica If not better discussed spirolactone versus ABX versus referral, she is on nexplanon. - clindamycin (CLEOCIN T) 1 % lotion; Apply topically 2 (two) times daily.  Dispense: 60 mL; Refill: 2  3. Adjustment disorder with mixed anxiety and depressed mood  stress management techniques discussed, increase water, good sleep hygiene discussed, increase exercise, and increase veggies.  Declines meds, has tried several, will try stress management techniques first.  - ALPRAZolam (XANAX) 0.5 MG tablet; Take 1 tablet (0.5 mg total) by mouth 2 (two) times daily as needed for anxiety or sleep.  Dispense: 60 tablet; Refill: 0  Follow up 3 months or sooner.   HPI 24 y.o.female presents for 6 month follow up for medications review.  She has acne and is worse on her chin/face, very deep, tender and cystic acne. She was on the benzamycin gel but it only helped with redness and not the acne.  She has nexplanon, has 1 more year and then will get another. She states her anxiety/depression is worse, she is working out, eating better. She is on xanax 0.5 PRN, once a day at the most.  Has been on lexapro, wellbutrin, zoloft, celexa in the past without tolerance.  She is separating from husband, and has her 49 year old daughter.  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 Heel pain with occ numbness big toes.   Past Medical History  Diagnosis Date  . No pertinent past medical history   . Anxiety   . Depression   . Anemia      No Known Allergies    Current Outpatient Prescriptions on File Prior to Visit  Medication Sig Dispense Refill  . ALPRAZolam (XANAX) 0.5 MG tablet Take 1 tablet (0.5 mg total) by  mouth 3 (three) times daily as needed for sleep or anxiety. 30 tablet 0  . calcium carbonate (TUMS - DOSED IN MG ELEMENTAL CALCIUM) 500 MG chewable tablet Chew 1 tablet by mouth as needed. heartburn    . ibuprofen (ADVIL,MOTRIN) 600 MG tablet Take 1 tablet (600 mg total) by mouth every 6 (six) hours as needed for pain. 30 tablet 0   No current facility-administered medications on file prior to visit.    ROS: all negative except above.   Physical Exam: Filed Weights   11/17/14 1501  Weight: 138 lb (62.596 kg)   BP 102/62 mmHg  Pulse 76  Temp(Src) 98.1 F (36.7 C)  Resp 16  Ht 5\' 2"  (1.575 m)  Wt 138 lb (62.596 kg)  BMI 25.23 kg/m2  LMP 10/09/2014 General Appearance: Well nourished, in no apparent distress. Eyes: PERRLA, EOMs, conjunctiva no swelling or erythema Sinuses: No Frontal/maxillary tenderness ENT/Mouth: Ext aud canals clear, TMs without erythema, bulging. No erythema, swelling, or exudate on post pharynx.  Tonsils not swollen or erythematous. Hearing normal.  Neck: Supple, thyroid normal.  Respiratory: Respiratory effort normal, BS equal bilaterally without rales, rhonchi, wheezing or stridor.  Cardio: RRR with no MRGs. Brisk peripheral pulses without edema.  Abdomen: Soft, + BS.  Non tender, no guarding, rebound, hernias, masses. Lymphatics: Non tender without lymphadenopathy.  Musculoskeletal: Full ROM, 5/5 strength, normal gait. Good pulses, good sensation bilaterally. Right heel pain, worse with dorsiflexion  Skin: Warm, dry without rashes, lesions, ecchymosis.  Neuro: Cranial nerves intact. Normal muscle tone, no cerebellar symptoms. Sensation intact.  Psych: Awake and oriented X 3, normal affect, Insight and Judgment appropriate.     Vicie Mutters, PA-C 3:09 PM [facility]

## 2014-11-25 ENCOUNTER — Other Ambulatory Visit: Payer: Self-pay | Admitting: Internal Medicine

## 2014-11-25 MED ORDER — AZITHROMYCIN 250 MG PO TABS: ORAL_TABLET | ORAL | Status: DC

## 2014-11-25 MED ORDER — PREDNISONE 20 MG PO TABS: ORAL_TABLET | ORAL | Status: DC

## 2014-11-25 NOTE — Progress Notes (Signed)
Patient aware of instructions.  

## 2014-12-09 ENCOUNTER — Telehealth: Payer: Self-pay | Admitting: Physician Assistant

## 2014-12-09 MED ORDER — ONDANSETRON HCL 4 MG PO TABS: 4.0000 mg | ORAL_TABLET | Freq: Three times a day (TID) | ORAL | Status: DC | PRN

## 2014-12-09 MED ORDER — HYOSCYAMINE SULFATE 0.125 MG PO TABS: 0.1250 mg | ORAL_TABLET | ORAL | Status: DC | PRN

## 2014-12-09 NOTE — Telephone Encounter (Signed)
24 y.o. female sudden diarrhea, vomiting, and bodyaches, this AM. Will send in zofran/levisin for possible gastroenteritis, push fluids, 1/2 gatorade/water. Patient cautioned if any AB pain, if severe go to ER tonight, if starts tomorrow get in BEFORE 12 since we close.

## 2014-12-09 NOTE — Telephone Encounter (Signed)
Patient aware of instructions per Vicie Mutters, PA

## 2015-02-12 ENCOUNTER — Emergency Department (HOSPITAL_COMMUNITY)
Admission: EM | Admit: 2015-02-12 | Discharge: 2015-02-12 | Disposition: A | Discharge status: Home / Self Care | Payer: 59 | Attending: Emergency Medicine | Admitting: Emergency Medicine

## 2015-02-12 ENCOUNTER — Encounter (HOSPITAL_COMMUNITY): Payer: Self-pay | Admitting: General Practice

## 2015-02-12 DIAGNOSIS — Z862 Personal history of diseases of the blood and blood-forming organs and certain disorders involving the immune mechanism: Secondary | ICD-10-CM | POA: Diagnosis not present

## 2015-02-12 DIAGNOSIS — G479 Sleep disorder, unspecified: Secondary | ICD-10-CM | POA: Diagnosis not present

## 2015-02-12 DIAGNOSIS — F410 Panic disorder [episodic paroxysmal anxiety] without agoraphobia: Secondary | ICD-10-CM

## 2015-02-12 DIAGNOSIS — Z792 Long term (current) use of antibiotics: Secondary | ICD-10-CM | POA: Insufficient documentation

## 2015-02-12 DIAGNOSIS — R635 Abnormal weight gain: Secondary | ICD-10-CM | POA: Diagnosis not present

## 2015-02-12 DIAGNOSIS — Z7952 Long term (current) use of systemic steroids: Secondary | ICD-10-CM | POA: Diagnosis not present

## 2015-02-12 DIAGNOSIS — R630 Anorexia: Secondary | ICD-10-CM | POA: Insufficient documentation

## 2015-02-12 DIAGNOSIS — F329 Major depressive disorder, single episode, unspecified: Secondary | ICD-10-CM | POA: Diagnosis not present

## 2015-02-12 NOTE — ED Notes (Signed)
Pt complaining of an anxiety attack that started around 0900 this morning after a confrontation with parents. The anxiety attack reached its climax around 1130 and pt stated "I couldn't breath".  Pt took 1 mg of Xanax this morning around 0930 with no relief. Pt reporting feelings of exhaustion and stiffness of her neck and back. Pt estimates that her anxiety attack climax lasted about 3-4 minutes.

## 2015-02-12 NOTE — Discharge Instructions (Signed)

## 2015-02-12 NOTE — ED Provider Notes (Addendum)
CSN: [id number]     Arrival date & time 02/12/15  1158 History   First MD Initiated Contact with Patient 02/12/15 1200     Chief Complaint  Patient presents with  . Panic Attack     (Consider location/radiation/quality/duration/timing/severity/associated sxs/prior Treatment) HPI Comments: Recently sleeping a little better but decreased appetite and mild weight gain but not exercising or trying to eat healthy.  Patient is a 24 y.o. female presenting with anxiety. The history is provided by the patient.  Anxiety This is a chronic problem. The problem occurs constantly. The problem has been gradually worsening. Associated symptoms comments: Pt states she had a panic attack today.  She has had worsening ongoing stressors and this morning had a confrontation with her parents and around 11 o'clock started to feel like she couldn't breath.  She felt like she was going to die.  Her heart was racing and lasted about 5 min.  Now she feels more calm but is just tired and stiff.. The symptoms are aggravated by stress. Nothing relieves the symptoms. Treatments tried: took xanax today which help some. The treatment provided significant relief.    Past Medical History  Diagnosis Date  . No pertinent past medical history   . Anxiety   . Depression   . Anemia    Past Surgical History  Procedure Laterality Date  . Eye surgery      for lazy eye  . Wisdom tooth extraction    . Nexplanon      insertion 2/14   Family History  Problem Relation Age of Onset  . Other Neg Hx   . Diabetes Father   . Heart attack Father   . COPD Maternal Grandmother   . Cancer Maternal Grandmother     bone  . Stroke Maternal Grandfather    History  Substance Use Topics  . Smoking status: Never Smoker   . Smokeless tobacco: Never Used  . Alcohol Use: 1.8 - 2.4 oz/week    3-4 Standard drinks or equivalent per week   OB History    Gravida Para Term Preterm AB TAB SAB Ectopic Multiple Living   1 1 1  0 0 0 0 0 0 1      Review of Systems  All other systems reviewed and are negative.     Allergies  Review of patient's allergies indicates no known allergies.  Home Medications   Prior to Admission medications   Medication Sig Start Date End Date Taking? Authorizing Provider  ALPRAZolam Duanne Moron) 0.5 MG tablet Take 1 tablet (0.5 mg total) by mouth 2 (two) times daily as needed for anxiety or sleep. 11/17/14 11/17/15  Vicie Mutters, PA-C  azithromycin (ZITHROMAX Z-PAK) 250 MG tablet 2 po day one, then 1 daily x 4 days 11/25/14   Starlyn Skeans, PA-C  calcium carbonate (TUMS - DOSED IN MG ELEMENTAL CALCIUM) 500 MG chewable tablet Chew 1 tablet by mouth as needed. heartburn    Historical Provider, MD  clindamycin (CLEOCIN T) 1 % lotion Apply topically 2 (two) times daily. 11/17/14   Vicie Mutters, PA-C  hyoscyamine (LEVSIN) 0.125 MG tablet Take 1 tablet (0.125 mg total) by mouth every 4 (four) hours as needed for cramping (diarrhea, nausea). 12/09/14   Vicie Mutters, PA-C  ibuprofen (ADVIL,MOTRIN) 600 MG tablet Take 1 tablet (600 mg total) by mouth every 6 (six) hours as needed for pain. 08/31/12   Newton Pigg, MD  ondansetron (ZOFRAN) 4 MG tablet Take 1 tablet (4 mg total) by mouth every  8 (eight) hours as needed for nausea or vomiting. 12/09/14   Vicie Mutters, PA-C  predniSONE (DELTASONE) 20 MG tablet 3 tabs po day one, then 2 tabs daily x 4 days 11/25/14   Loma Sousa Forcucci, PA-C   BP 117/67 mmHg  Pulse 82  Temp(Src) 98.4 F (36.9 C) (Oral)  Resp 18  Ht 5\' 2"  (1.575 m)  Wt 135 lb (61.236 kg)  BMI 24.69 kg/m2  SpO2 100%  LMP  (Within Months) Physical Exam  Constitutional: She is oriented to person, place, and time. She appears well-developed and well-nourished. No distress.  HENT:  Head: Normocephalic and atraumatic.  Mouth/Throat: Oropharynx is clear and moist.  Eyes: Conjunctivae and EOM are normal. Pupils are equal, round, and reactive to light.  Neck: Normal range of motion. Neck supple.   Cardiovascular: Normal rate, regular rhythm and intact distal pulses.   No murmur heard. Pulmonary/Chest: Effort normal and breath sounds normal. No respiratory distress. She has no wheezes. She has no rales.  Abdominal: Soft. She exhibits no distension. There is no tenderness. There is no rebound and no guarding.  Musculoskeletal: Normal range of motion. She exhibits no edema or tenderness.  Neurological: She is alert and oriented to person, place, and time.  Skin: Skin is warm and dry. No rash noted. No erythema.  Psychiatric: Her speech is normal and behavior is normal. Her mood appears anxious. She is not actively hallucinating. Cognition and memory are not impaired. She does not express impulsivity or inappropriate judgment. She exhibits a depressed mood. She expresses no homicidal and no suicidal ideation. She expresses no suicidal plans and no homicidal plans.  Tearful on exam  Nursing note and vitals reviewed.   ED Course  Procedures (including critical care time) Labs Review Labs Reviewed - No data to display  Imaging Review No results found.   EKG Interpretation None      MDM   Final diagnoses:  Panic attack    Patient presents with symptoms most suggestive of panic attack today. She has ongoing issues with anxiety and depression who is currently not taking medication for them. She says whenever bringing it up with her doctor may just want to start a medication but she has never been in counseling or seeing a therapist. She denies any suicidal ideation at this time but occasionally feels like it might be better if she weren't around. She denies wanting to hurt herself or anybody else at this time. She does drink heavily on the weekends but denies daily use of alcohol. She has Xanax to take when necessary but does not take it regularly. She did take it today after the panic attack started. She now feels better. Feel that patient would benefit from seeing a therapist and a  psychiatrist. Encouraged her to call the number on her insurance card but also given referrals.  Patient has an implant so low suspicion for pregnancy. She takes no medications at this time and has normal vital signs.    Blanchie Dessert, MD 02/12/15 1229  Blanchie Dessert, MD 02/12/15 1230

## 2015-02-12 NOTE — ED Notes (Signed)
Pt is in stable ccondition upon d/c and ambulates from ED.

## 2015-05-03 ENCOUNTER — Encounter: Payer: Self-pay | Admitting: Physician Assistant

## 2015-05-05 ENCOUNTER — Ambulatory Visit (INDEPENDENT_AMBULATORY_CARE_PROVIDER_SITE_OTHER): Payer: 59 | Admitting: Physician Assistant

## 2015-05-05 ENCOUNTER — Encounter: Payer: Self-pay | Admitting: Physician Assistant

## 2015-05-05 VITALS — BP 122/80 | HR 75 | Temp 97.9°F | Resp 16 | Ht 63.0 in | Wt 140.4 lb

## 2015-05-05 DIAGNOSIS — K219 Gastro-esophageal reflux disease without esophagitis: Secondary | ICD-10-CM | POA: Insufficient documentation

## 2015-05-05 DIAGNOSIS — Z0000 Encounter for general adult medical examination without abnormal findings: Secondary | ICD-10-CM | POA: Diagnosis not present

## 2015-05-05 DIAGNOSIS — Z131 Encounter for screening for diabetes mellitus: Secondary | ICD-10-CM

## 2015-05-05 DIAGNOSIS — D649 Anemia, unspecified: Secondary | ICD-10-CM

## 2015-05-05 DIAGNOSIS — D229 Melanocytic nevi, unspecified: Secondary | ICD-10-CM

## 2015-05-05 DIAGNOSIS — F4323 Adjustment disorder with mixed anxiety and depressed mood: Secondary | ICD-10-CM

## 2015-05-05 DIAGNOSIS — R790 Abnormal level of blood mineral: Secondary | ICD-10-CM

## 2015-05-05 DIAGNOSIS — Z79899 Other long term (current) drug therapy: Secondary | ICD-10-CM

## 2015-05-05 DIAGNOSIS — F329 Major depressive disorder, single episode, unspecified: Secondary | ICD-10-CM

## 2015-05-05 DIAGNOSIS — Z13220 Encounter for screening for lipoid disorders: Secondary | ICD-10-CM

## 2015-05-05 DIAGNOSIS — Z1389 Encounter for screening for other disorder: Secondary | ICD-10-CM

## 2015-05-05 DIAGNOSIS — F32A Depression, unspecified: Secondary | ICD-10-CM

## 2015-05-05 DIAGNOSIS — F419 Anxiety disorder, unspecified: Secondary | ICD-10-CM

## 2015-05-05 LAB — CBC WITH DIFFERENTIAL/PLATELET
BASOS PCT: 1 % (ref 0–1)
Basophils Absolute: 0 10*3/uL (ref 0.0–0.1)
Eosinophils Absolute: 0.1 10*3/uL (ref 0.0–0.7)
Eosinophils Relative: 3 % (ref 0–5)
HCT: 38.3 % (ref 36.0–46.0)
HEMOGLOBIN: 12.9 g/dL (ref 12.0–15.0)
LYMPHS ABS: 1.7 10*3/uL (ref 0.7–4.0)
LYMPHS PCT: 36 % (ref 12–46)
MCH: 29.8 pg (ref 26.0–34.0)
MCHC: 33.7 g/dL (ref 30.0–36.0)
MCV: 88.5 fL (ref 78.0–100.0)
MONOS PCT: 8 % (ref 3–12)
MPV: 10.9 fL (ref 8.6–12.4)
Monocytes Absolute: 0.4 10*3/uL (ref 0.1–1.0)
NEUTROS ABS: 2.4 10*3/uL (ref 1.7–7.7)
NEUTROS PCT: 52 % (ref 43–77)
Platelets: 221 10*3/uL (ref 150–400)
RBC: 4.33 MIL/uL (ref 3.87–5.11)
RDW: 13.3 % (ref 11.5–15.5)
WBC: 4.7 10*3/uL (ref 4.0–10.5)

## 2015-05-05 MED ORDER — ALPRAZOLAM 0.5 MG PO TABS: 0.5000 mg | ORAL_TABLET | Freq: Two times a day (BID) | ORAL | Status: AC | PRN

## 2015-05-05 NOTE — Patient Instructions (Signed)
Preventive Care for Adults A healthy lifestyle and preventive care can promote health and wellness. Preventive health guidelines for women include the following key practices.  A routine yearly physical is a good way to check with your health care provider about your health and preventive screening. It is a chance to share any concerns and updates on your health and to receive a thorough exam.  Visit your dentist for a routine exam and preventive care every 6 months. Brush your teeth twice a day and floss once a day. Good oral hygiene prevents tooth decay and gum disease.  The frequency of eye exams is based on your age, health, family medical history, use of contact lenses, and other factors. Follow your health care provider's recommendations for frequency of eye exams.  Eat a healthy diet. Foods like vegetables, fruits, whole grains, low-fat dairy products, and lean protein foods contain the nutrients you need without too many calories. Decrease your intake of foods high in solid fats, added sugars, and salt. Eat the right amount of calories for you.Get information about a proper diet from your health care provider, if necessary.  Regular physical exercise is one of the most important things you can do for your health. Most adults should get at least 150 minutes of moderate-intensity exercise (any activity that increases your heart rate and causes you to sweat) each week. In addition, most adults need muscle-strengthening exercises on 2 or more days a week.  Maintain a healthy weight. The body mass index (BMI) is a screening tool to identify possible weight problems. It provides an estimate of body fat based on height and weight. Your health care provider can find your BMI and can help you achieve or maintain a healthy weight.For adults 20 years and older:  A BMI below 18.5 is considered underweight.  A BMI of 18.5 to 24.9 is normal.  A BMI of 25 to 29.9 is considered overweight.  A BMI of  30 and above is considered obese.  Maintain normal blood lipids and cholesterol levels by exercising and minimizing your intake of saturated fat. Eat a balanced diet with plenty of fruit and vegetables. Blood tests for lipids and cholesterol should begin at age 76 and be repeated every 5 years. If your lipid or cholesterol levels are high, you are over 50, or you are at high risk for heart disease, you may need your cholesterol levels checked more frequently.Ongoing high lipid and cholesterol levels should be treated with medicines if diet and exercise are not working.  If you smoke, find out from your health care provider how to quit. If you do not use tobacco, do not start.  Lung cancer screening is recommended for adults aged 22-80 years who are at high risk for developing lung cancer because of a history of smoking. A yearly low-dose CT scan of the lungs is recommended for people who have at least a 30-pack-year history of smoking and are a current smoker or have quit within the past 15 years. A pack year of smoking is smoking an average of 1 pack of cigarettes a day for 1 year (for example: 1 pack a day for 30 years or 2 packs a day for 15 years). Yearly screening should continue until the smoker has stopped smoking for at least 15 years. Yearly screening should be stopped for people who develop a health problem that would prevent them from having lung cancer treatment.  If you are pregnant, do not drink alcohol. If you are breastfeeding,  be very cautious about drinking alcohol. If you are not pregnant and choose to drink alcohol, do not have more than 1 drink per day. One drink is considered to be 12 ounces (355 mL) of beer, 5 ounces (148 mL) of wine, or 1.5 ounces (44 mL) of liquor.  Avoid use of street drugs. Do not share needles with anyone. Ask for help if you need support or instructions about stopping the use of drugs.  High blood pressure causes heart disease and increases the risk of  stroke. Your blood pressure should be checked at least every 1 to 2 years. Ongoing high blood pressure should be treated with medicines if weight loss and exercise do not work.  If you are 3-86 years old, ask your health care provider if you should take aspirin to prevent strokes.  Diabetes screening involves taking a blood sample to check your fasting blood sugar level. This should be done once every 3 years, after age 67, if you are within normal weight and without risk factors for diabetes. Testing should be considered at a younger age or be carried out more frequently if you are overweight and have at least 1 risk factor for diabetes.  Breast cancer screening is essential preventive care for women. You should practice "breast self-awareness." This means understanding the normal appearance and feel of your breasts and may include breast self-examination. Any changes detected, no matter how small, should be reported to a health care provider. Women in their 8s and 30s should have a clinical breast exam (CBE) by a health care provider as part of a regular health exam every 1 to 3 years. After age 70, women should have a CBE every year. Starting at age 25, women should consider having a mammogram (breast X-ray test) every year. Women who have a family history of breast cancer should talk to their health care provider about genetic screening. Women at a high risk of breast cancer should talk to their health care providers about having an MRI and a mammogram every year.  Breast cancer gene (BRCA)-related cancer risk assessment is recommended for women who have family members with BRCA-related cancers. BRCA-related cancers include breast, ovarian, tubal, and peritoneal cancers. Having family members with these cancers may be associated with an increased risk for harmful changes (mutations) in the breast cancer genes BRCA1 and BRCA2. Results of the assessment will determine the need for genetic counseling and  BRCA1 and BRCA2 testing.  Routine pelvic exams to screen for cancer are no longer recommended for nonpregnant women who are considered low risk for cancer of the pelvic organs (ovaries, uterus, and vagina) and who do not have symptoms. Ask your health care provider if a screening pelvic exam is right for you.  If you have had past treatment for cervical cancer or a condition that could lead to cancer, you need Pap tests and screening for cancer for at least 20 years after your treatment. If Pap tests have been discontinued, your risk factors (such as having a new sexual partner) need to be reassessed to determine if screening should be resumed. Some women have medical problems that increase the chance of getting cervical cancer. In these cases, your health care provider may recommend more frequent screening and Pap tests.  The HPV test is an additional test that may be used for cervical cancer screening. The HPV test looks for the virus that can cause the cell changes on the cervix. The cells collected during the Pap test can be  tested for HPV. The HPV test could be used to screen women aged 30 years and older, and should be used in women of any age who have unclear Pap test results. After the age of 30, women should have HPV testing at the same frequency as a Pap test.  Colorectal cancer can be detected and often prevented. Most routine colorectal cancer screening begins at the age of 50 years and continues through age 75 years. However, your health care provider may recommend screening at an earlier age if you have risk factors for colon cancer. On a yearly basis, your health care provider may provide home test kits to check for hidden blood in the stool. Use of a small camera at the end of a tube, to directly examine the colon (sigmoidoscopy or colonoscopy), can detect the earliest forms of colorectal cancer. Talk to your health care provider about this at age 50, when routine screening begins. Direct  exam of the colon should be repeated every 5-10 years through age 75 years, unless early forms of pre-cancerous polyps or small growths are found.  People who are at an increased risk for hepatitis B should be screened for this virus. You are considered at high risk for hepatitis B if:  You were born in a country where hepatitis B occurs often. Talk with your health care provider about which countries are considered high risk.  Your parents were born in a high-risk country and you have not received a shot to protect against hepatitis B (hepatitis B vaccine).  You have HIV or AIDS.  You use needles to inject street drugs.  You live with, or have sex with, someone who has hepatitis B.  You get hemodialysis treatment.  You take certain medicines for conditions like cancer, organ transplantation, and autoimmune conditions.  Hepatitis C blood testing is recommended for all people born from 1945 through 1965 and any individual with known risks for hepatitis C.  Practice safe sex. Use condoms and avoid high-risk sexual practices to reduce the spread of sexually transmitted infections (STIs). STIs include gonorrhea, chlamydia, syphilis, trichomonas, herpes, HPV, and human immunodeficiency virus (HIV). Herpes, HIV, and HPV are viral illnesses that have no cure. They can result in disability, cancer, and death.  You should be screened for sexually transmitted illnesses (STIs) including gonorrhea and chlamydia if:  You are sexually active and are younger than 24 years.  You are older than 24 years and your health care provider tells you that you are at risk for this type of infection.  Your sexual activity has changed since you were last screened and you are at an increased risk for chlamydia or gonorrhea. Ask your health care provider if you are at risk.  If you are at risk of being infected with HIV, it is recommended that you take a prescription medicine daily to prevent HIV infection. This is  called preexposure prophylaxis (PrEP). You are considered at risk if:  You are a heterosexual woman, are sexually active, and are at increased risk for HIV infection.  You take drugs by injection.  You are sexually active with a partner who has HIV.  Talk with your health care provider about whether you are at high risk of being infected with HIV. If you choose to begin PrEP, you should first be tested for HIV. You should then be tested every 3 months for as long as you are taking PrEP.  Osteoporosis is a disease in which the bones lose minerals and strength   with aging. This can result in serious bone fractures or breaks. The risk of osteoporosis can be identified using a bone density scan. Women ages 65 years and over and women at risk for fractures or osteoporosis should discuss screening with their health care providers. Ask your health care provider whether you should take a calcium supplement or vitamin D to reduce the rate of osteoporosis.  Menopause can be associated with physical symptoms and risks. Hormone replacement therapy is available to decrease symptoms and risks. You should talk to your health care provider about whether hormone replacement therapy is right for you.  Use sunscreen. Apply sunscreen liberally and repeatedly throughout the day. You should seek shade when your shadow is shorter than you. Protect yourself by wearing long sleeves, pants, a wide-brimmed hat, and sunglasses year round, whenever you are outdoors.  Once a month, do a whole body skin exam, using a mirror to look at the skin on your back. Tell your health care provider of new moles, moles that have irregular borders, moles that are larger than a pencil eraser, or moles that have changed in shape or color.  Stay current with required vaccines (immunizations).  Influenza vaccine. All adults should be immunized every year.  Tetanus, diphtheria, and acellular pertussis (Td, Tdap) vaccine. Pregnant women should  receive 1 dose of Tdap vaccine during each pregnancy. The dose should be obtained regardless of the length of time since the last dose. Immunization is preferred during the 27th-36th week of gestation. An adult who has not previously received Tdap or who does not know her vaccine status should receive 1 dose of Tdap. This initial dose should be followed by tetanus and diphtheria toxoids (Td) booster doses every 10 years. Adults with an unknown or incomplete history of completing a 3-dose immunization series with Td-containing vaccines should begin or complete a primary immunization series including a Tdap dose. Adults should receive a Td booster every 10 years.  Varicella vaccine. An adult without evidence of immunity to varicella should receive 2 doses or a second dose if she has previously received 1 dose. Pregnant females who do not have evidence of immunity should receive the first dose after pregnancy. This first dose should be obtained before leaving the health care facility. The second dose should be obtained 4-8 weeks after the first dose.  Human papillomavirus (HPV) vaccine. Females aged 13-26 years who have not received the vaccine previously should obtain the 3-dose series. The vaccine is not recommended for use in pregnant females. However, pregnancy testing is not needed before receiving a dose. If a female is found to be pregnant after receiving a dose, no treatment is needed. In that case, the remaining doses should be delayed until after the pregnancy. Immunization is recommended for any person with an immunocompromised condition through the age of 26 years if she did not get any or all doses earlier. During the 3-dose series, the second dose should be obtained 4-8 weeks after the first dose. The third dose should be obtained 24 weeks after the first dose and 16 weeks after the second dose.  Zoster vaccine. One dose is recommended for adults aged 60 years or older unless certain conditions are  present.  Measles, mumps, and rubella (MMR) vaccine. Adults born before 1957 generally are considered immune to measles and mumps. Adults born in 1957 or later should have 1 or more doses of MMR vaccine unless there is a contraindication to the vaccine or there is laboratory evidence of immunity to   each of the three diseases. A routine second dose of MMR vaccine should be obtained at least 28 days after the first dose for students attending postsecondary schools, health care workers, or international travelers. People who received inactivated measles vaccine or an unknown type of measles vaccine during 1963-1967 should receive 2 doses of MMR vaccine. People who received inactivated mumps vaccine or an unknown type of mumps vaccine before 1979 and are at high risk for mumps infection should consider immunization with 2 doses of MMR vaccine. For females of childbearing age, rubella immunity should be determined. If there is no evidence of immunity, females who are not pregnant should be vaccinated. If there is no evidence of immunity, females who are pregnant should delay immunization until after pregnancy. Unvaccinated health care workers born before 1957 who lack laboratory evidence of measles, mumps, or rubella immunity or laboratory confirmation of disease should consider measles and mumps immunization with 2 doses of MMR vaccine or rubella immunization with 1 dose of MMR vaccine.  Pneumococcal 13-valent conjugate (PCV13) vaccine. When indicated, a person who is uncertain of her immunization history and has no record of immunization should receive the PCV13 vaccine. An adult aged 19 years or older who has certain medical conditions and has not been previously immunized should receive 1 dose of PCV13 vaccine. This PCV13 should be followed with a dose of pneumococcal polysaccharide (PPSV23) vaccine. The PPSV23 vaccine dose should be obtained at least 8 weeks after the dose of PCV13 vaccine. An adult aged 19  years or older who has certain medical conditions and previously received 1 or more doses of PPSV23 vaccine should receive 1 dose of PCV13. The PCV13 vaccine dose should be obtained 1 or more years after the last PPSV23 vaccine dose.  Pneumococcal polysaccharide (PPSV23) vaccine. When PCV13 is also indicated, PCV13 should be obtained first. All adults aged 65 years and older should be immunized. An adult younger than age 65 years who has certain medical conditions should be immunized. Any person who resides in a nursing home or long-term care facility should be immunized. An adult smoker should be immunized. People with an immunocompromised condition and certain other conditions should receive both PCV13 and PPSV23 vaccines. People with human immunodeficiency virus (HIV) infection should be immunized as soon as possible after diagnosis. Immunization during chemotherapy or radiation therapy should be avoided. Routine use of PPSV23 vaccine is not recommended for American Indians, [place] Natives, or people younger than 65 years unless there are medical conditions that require PPSV23 vaccine. When indicated, people who have unknown immunization and have no record of immunization should receive PPSV23 vaccine. One-time revaccination 5 years after the first dose of PPSV23 is recommended for people aged 19-64 years who have chronic kidney failure, nephrotic syndrome, asplenia, or immunocompromised conditions. People who received 1-2 doses of PPSV23 before age 65 years should receive another dose of PPSV23 vaccine at age 65 years or later if at least 5 years have passed since the previous dose. Doses of PPSV23 are not needed for people immunized with PPSV23 at or after age 65 years.  Meningococcal vaccine. Adults with asplenia or persistent complement component deficiencies should receive 2 doses of quadrivalent meningococcal conjugate (MenACWY-D) vaccine. The doses should be obtained at least 2 months apart.  Microbiologists working with certain meningococcal bacteria, military recruits, people at risk during an outbreak, and people who travel to or live in countries with a high rate of meningitis should be immunized. A first-year college student up through age   21 years who is living in a residence hall should receive a dose if she did not receive a dose on or after her 16th birthday. Adults who have certain high-risk conditions should receive one or more doses of vaccine.  Hepatitis A vaccine. Adults who wish to be protected from this disease, have certain high-risk conditions, work with hepatitis A-infected animals, work in hepatitis A research labs, or travel to or work in countries with a high rate of hepatitis A should be immunized. Adults who were previously unvaccinated and who anticipate close contact with an international adoptee during the first 60 days after arrival in the [place] from a country with a high rate of hepatitis A should be immunized.  Hepatitis B vaccine. Adults who wish to be protected from this disease, have certain high-risk conditions, may be exposed to blood or other infectious body fluids, are household contacts or sex partners of hepatitis B positive people, are clients or workers in certain care facilities, or travel to or work in countries with a high rate of hepatitis B should be immunized.  Haemophilus influenzae type b (Hib) vaccine. A previously unvaccinated person with asplenia or sickle cell disease or having a scheduled splenectomy should receive 1 dose of Hib vaccine. Regardless of previous immunization, a recipient of a hematopoietic stem cell transplant should receive a 3-dose series 6-12 months after her successful transplant. Hib vaccine is not recommended for adults with HIV infection. Preventive Services / Frequency  Ages 19 to 39 years 1. Blood pressure check. 2. Lipid and cholesterol check. 3. Clinical breast exam.** / Every 3 years for women in their  20s and 30s. 4. BRCA-related cancer risk assessment.** / For women who have family members with a BRCA-related cancer (breast, ovarian, tubal, or peritoneal cancers). 5. Pap test.** / Every 2 years from ages 21 through 29. Every 3 years starting at age 30 through age 65 or 70 with a history of 3 consecutive normal Pap tests. 6. HPV screening.** / Every 3 years from ages 30 through ages 65 to 70 with a history of 3 consecutive normal Pap tests. 7. Hepatitis C blood test.** / For any individual with known risks for hepatitis C. 8. Skin self-exam. / Monthly. 9. Influenza vaccine. / Every year. 10. Tetanus, diphtheria, and acellular pertussis (Tdap, Td) vaccine.** / Consult your health care provider. Pregnant women should receive 1 dose of Tdap vaccine during each pregnancy. 1 dose of Td every 10 years. 11. Varicella vaccine.** / Consult your health care provider. Pregnant females who do not have evidence of immunity should receive the first dose after pregnancy. 12. HPV vaccine. / 3 doses over 6 months, if 26 and younger. The vaccine is not recommended for use in pregnant females. However, pregnancy testing is not needed before receiving a dose. 13. Measles, mumps, rubella (MMR) vaccine.** / You need at least 1 dose of MMR if you were born in 1957 or later. You may also need a 2nd dose. For females of childbearing age, rubella immunity should be determined. If there is no evidence of immunity, females who are not pregnant should be vaccinated. If there is no evidence of immunity, females who are pregnant should delay immunization until after pregnancy. 14. Pneumococcal 13-valent conjugate (PCV13) vaccine.** / Consult your health care provider. 15. Pneumococcal polysaccharide (PPSV23) vaccine.** / 1 to 2 doses if you smoke cigarettes or if you have certain conditions. 16. Meningococcal vaccine.** / 1 dose if you are age 19 to 21 years and a   first-year college student living in a residence hall, or have one  of several medical conditions, you need to get vaccinated against meningococcal disease. You may also need additional booster doses. 17. Hepatitis A vaccine.** / Consult your health care provider. 18. Hepatitis B vaccine.** / Consult your health care provider. 19. Haemophilus influenzae type b (Hib) vaccine.** / Consult your health care provider.  24. Chlamydia, HIV, and other sexually transmitted diseases- Get screened every year until age 54, then within three months of each new sexual provider. 21. Pap Smear- Every 1-3 years; discuss with your health care provider. 28. Mammogram- Every year starting at age 40  Take these steps 1. Do not smoke-Your healthcare provider can help you quit.  For tips on how to quit go to www.smokefree.gov or call 1-800 QUITNOW. 2. Be physically active- Exercise 5 days a week for at least 30 minutes.  If you are not already physically active, start slow and gradually work up to 30 minutes of moderate physical activity.  Examples of moderate activity include walking briskly, dancing, swimming, bicycling, etc. 3. Breast Cancer- A self breast exam every month is important for early detection of breast cancer.  For more information and instruction on self breast exams, ask your healthcare provider or https://www.patel.info/. 4. Eat a healthy diet- Eat a variety of healthy foods such as fruits, vegetables, whole grains, low fat milk, low fat cheeses, yogurt, lean meats, poultry and fish, beans, nuts, tofu, etc.  For more information go to www. Thenutritionsource.org 5. Drink alcohol in moderation- Limit alcohol intake to one drink or less per day. Never drink and drive. 6. Depression- Your emotional health is as important as your physical health.  If you're feeling down or losing interest in things you normally enjoy please talk to your healthcare provider about being screened for depression. 7. Dental visit- Brush and floss your teeth twice daily;  visit your dentist twice a year. 8. Eye doctor- Get an eye exam at least every 2 years. 9. Helmet use- Always wear a helmet when riding a bicycle, motorcycle, rollerblading or skateboarding. 6. Safe sex- If you may be exposed to sexually transmitted infections, use a condom. 11. Seat belts- Seat belts can save your live; always wear one. 12. Smoke/Carbon Monoxide detectors- These detectors need to be installed on the appropriate level of your home. Replace batteries at least once a year. 13. Skin cancer- When out in the sun please cover up and use sunscreen 15 SPF or higher. 14. Violence- If anyone is threatening or hurting you, please tell your healthcare provider.

## 2015-05-05 NOTE — Progress Notes (Signed)
Complete Physical  Assessment and Plan: 1. Depression Depression- continue medications, stress management techniques discussed, increase water, good sleep hygiene discussed, increase exercise, and increase veggies.  - TSH  2. Anxiety continue medications, stress management techniques discussed, increase water, good sleep hygiene discussed, increase exercise, and increase veggies.  - TSH  3. Anemia, unspecified anemia type - Iron and TIBC - Ferritin - Vitamin B12  4. Routine general medical examination at a health care facility - CBC with Differential/Platelet - BASIC METABOLIC PANEL WITH GFR - Hepatic function panel - TSH - Lipid panel - Hemoglobin A1c - Insulin, fasting - Magnesium - Urinalysis, Routine w reflex microscopic (not at [facility]) - Microalbumin / creatinine urine ratio - Iron and TIBC - Ferritin - Vitamin B12  5. Low magnesium levels - Magnesium  6. Screening for diabetes mellitus Has family history - Hemoglobin A1c - Insulin, fasting  7. Screening cholesterol level - Lipid panel  8. Screening for blood or protein in urine - Urinalysis, Routine w reflex microscopic (not at [facility]) - Microalbumin / creatinine urine ratio  9. Gastroesophageal reflux disease, esophagitis presence not specified Continue PPI/H2 blocker, diet discussed  10. Medication management - CBC with Differential/Platelet - BASIC METABOLIC PANEL WITH GFR - Hepatic function panel  11. Nevus atypical Family history of dysplastic cells- will scheudle removal  Discussed med's effects and SE's. Screening labs and tests as requested with regular follow-up as recommended.  HPI 24 y.o. female  presents for a complete physical.  Her blood pressure has been controlled at home, today their BP is BP: 122/80 mmHg She does workout. She denies chest pain, shortness of breath, dizziness.  Patient is on Vitamin D supplement.   Lab Results  Component Value Date   VD25OH 55 04/28/2014     She  had a panic attack two months ago, went to ER, now seeing counseling but would like to see someone different. She has been on Lexapro, wellbutrin, zoloft, and celexa in the past which she did not tolerate. Still some fatigue/depression/and bad anxiety issues.  She still has some lower back pain, has changed beds so she is doing better, she has a 75 1/2 year old daughter and has gotten back together with her husband [place] and things are doing well.  She has had GERD on TUMS PRN.   Lab Results  Component Value Date   HGBA1C 5.5 04/28/2014   BMI is Body mass index is 24.88 kg/(m^2)., she is working on diet and exercise. Wt Readings from Last 3 Encounters:  05/05/15 140 lb 6.4 oz (63.685 kg)  02/12/15 135 lb (61.236 kg)  11/17/14 138 lb (62.596 kg)    Current Medications:  Current Outpatient Prescriptions on File Prior to Visit  Medication Sig Dispense Refill  . ALPRAZolam (XANAX) 0.5 MG tablet Take 1 tablet (0.5 mg total) by mouth 2 (two) times daily as needed for anxiety or sleep. 60 tablet 0   No current facility-administered medications on file prior to visit.   Health Maintenance:   Immunization History  Administered Date(s) Administered  . Influenza Split 08/30/2012  . MMR 08/31/2012  . Rho (D) Immune Globulin 08/30/2012  . Tdap 08/30/2012   Tetanus: 2014 Pneumovax: Prevnar 13:  Flu vaccine: 2014 Zostavax: Pap: 09/2013 negative at OB/GYN with negative STD testing.  MGM: N/A DEXA: Colonoscopy: EGD:  Allergies: No Known Allergies Medical History:  Past Medical History  Diagnosis Date  . Anxiety   . Depression   . Anemia    Surgical History:  Past Surgical History  Procedure Laterality Date  . Eye surgery      for lazy eye  . Wisdom tooth extraction    . Nexplanon      insertion 2/14   Family History:  Family History  Problem Relation Age of Onset  . Other Neg Hx   . Diabetes Father   . Heart attack Father   . COPD Maternal Grandmother   . Cancer  Maternal Grandmother     bone  . Stroke Maternal Grandfather    Social History:  Social History  Substance Use Topics  . Smoking status: Never Smoker   . Smokeless tobacco: Never Used  . Alcohol Use: 1.8 - 2.4 oz/week    3-4 Standard drinks or equivalent per week  Review of Systems  Constitutional: Negative.   HENT: Negative.   Eyes: Negative.   Respiratory: Negative.   Cardiovascular: Negative.   Gastrointestinal: Negative.   Genitourinary: Negative.   Musculoskeletal: Positive for back pain. Negative for myalgias, joint pain, falls and neck pain.  Skin: Negative.   Neurological: Negative.   Endo/Heme/Allergies: Negative.   Psychiatric/Behavioral: Negative for depression, suicidal ideas, hallucinations, memory loss and substance abuse. The patient is nervous/anxious. The patient does not have insomnia.     Physical Exam: Estimated body mass index is 24.88 kg/(m^2) as calculated from the following:   Height as of this encounter: $RemoveBeforeD'5\' 3"'LnMDuAmoVUIuoF$  (1.6 m).   Weight as of this encounter: 140 lb 6.4 oz (63.685 kg). BP 122/80 mmHg  Pulse 75  Temp(Src) 97.9 F (36.6 C)  Resp 16  Ht $R'5\' 3"'ad$  (1.6 m)  Wt 140 lb 6.4 oz (63.685 kg)  BMI 24.88 kg/m2 General Appearance: Well nourished, in no apparent distress. Eyes: PERRLA, EOMs, conjunctiva no swelling or erythema, normal fundi and vessels. Sinuses: No Frontal/maxillary tenderness ENT/Mouth: Ext aud canals clear, normal light reflex with TMs without erythema, bulging.  Good dentition. No erythema, swelling, or exudate on post pharynx. Tonsils not swollen or erythematous. Hearing normal.  Neck: Supple, thyroid normal. No bruits Respiratory: Respiratory effort normal, BS equal bilaterally without rales, rhonchi, wheezing or stridor. Cardio: RRR without murmurs, rubs or gallops. Brisk peripheral pulses without edema.  Chest: symmetric, with normal excursions and percussion. Breasts: defer Abdomen: Soft, +BS. Non tender, no guarding, rebound,  hernias, masses, or organomegaly. .  Lymphatics: Non tender without lymphadenopathy.  Genitourinary: defer Musculoskeletal: Full ROM all peripheral extremities,5/5 strength, and normal gait. Skin: Right breast with 4x4 mm irreg border brown nevus, and  2x2 irreg dark nevus on back of right arm.  Warm, dry without rashes, lesions, ecchymosis.  Neuro: Cranial nerves intact, reflexes equal bilaterally. Normal muscle tone, no cerebellar symptoms. Sensation intact.  Psych: Awake and oriented X 3, normal affect, Insight and Judgment appropriate.    Vicie Mutters 9:16 AM

## 2015-05-06 LAB — HEPATIC FUNCTION PANEL
ALT: 12 U/L (ref 6–29)
AST: 12 U/L (ref 10–30)
Albumin: 4.6 g/dL (ref 3.6–5.1)
Alkaline Phosphatase: 36 U/L (ref 33–115)
Bilirubin, Direct: 0.2 mg/dL (ref <=0.2)
Indirect Bilirubin: 0.6 mg/dL (ref 0.2–1.2)
Total Bilirubin: 0.8 mg/dL (ref 0.2–1.2)
Total Protein: 7 g/dL (ref 6.1–8.1)

## 2015-05-06 LAB — URINALYSIS, ROUTINE W REFLEX MICROSCOPIC
BILIRUBIN URINE: NEGATIVE
Glucose, UA: NEGATIVE
Hgb urine dipstick: NEGATIVE
Ketones, ur: NEGATIVE
Leukocytes, UA: NEGATIVE
Nitrite: NEGATIVE
PH: 8 (ref 5.0–8.0)
Protein, ur: NEGATIVE
SPECIFIC GRAVITY, URINE: 1.019 (ref 1.001–1.035)

## 2015-05-06 LAB — IRON AND TIBC
%SAT: 43 % (ref 11–50)
Iron: 137 ug/dL (ref 40–190)
TIBC: 319 ug/dL (ref 250–450)
UIBC: 182 ug/dL (ref 125–400)

## 2015-05-06 LAB — MICROALBUMIN / CREATININE URINE RATIO
Creatinine, Urine: 136 mg/dL
Microalb Creat Ratio: 1.5 mg/g (ref 0.0–30.0)
Microalb, Ur: 0.2 mg/dL (ref <2.0)

## 2015-05-06 LAB — BASIC METABOLIC PANEL WITH GFR
BUN: 13 mg/dL (ref 7–25)
CO2: 26 mmol/L (ref 20–31)
CREATININE: 0.55 mg/dL (ref 0.50–1.10)
Calcium: 9 mg/dL (ref 8.6–10.2)
Chloride: 107 mmol/L (ref 98–110)
GFR, Est African American: >89 mL/min (ref >=60)
GFR, Est Non African American: >89 mL/min (ref >=60)
Glucose, Bld: 84 mg/dL (ref 65–99)
Potassium: 4.3 mmol/L (ref 3.5–5.3)
SODIUM: 142 mmol/L (ref 135–146)

## 2015-05-06 LAB — LIPID PANEL
Cholesterol: 165 mg/dL (ref 125–200)
HDL: 65 mg/dL (ref >=46)
LDL CALC: 87 mg/dL (ref <130)
Total CHOL/HDL Ratio: 2.5 Ratio (ref <=5.0)
Triglycerides: 64 mg/dL (ref <150)
VLDL: 13 mg/dL (ref <30)

## 2015-05-06 LAB — FERRITIN: FERRITIN: 35 ng/mL (ref 10–291)

## 2015-05-06 LAB — VITAMIN B12: VITAMIN B 12: 560 pg/mL (ref 211–911)

## 2015-05-06 LAB — INSULIN, FASTING: Insulin fasting, serum: 4 u[IU]/mL (ref 2.0–19.6)

## 2015-05-06 LAB — TSH: TSH: 1.206 u[IU]/mL (ref 0.350–4.500)

## 2015-05-06 LAB — HEMOGLOBIN A1C
HEMOGLOBIN A1C: 5.3 % (ref <5.7)
Mean Plasma Glucose: 105 mg/dL (ref <117)

## 2015-05-06 LAB — MAGNESIUM: Magnesium: 2 mg/dL (ref 1.5–2.5)

## 2015-05-16 ENCOUNTER — Encounter: Payer: Self-pay | Admitting: Physician Assistant

## 2015-05-16 ENCOUNTER — Ambulatory Visit (INDEPENDENT_AMBULATORY_CARE_PROVIDER_SITE_OTHER): Payer: 59 | Admitting: Physician Assistant

## 2015-05-16 VITALS — BP 120/74 | HR 72 | Temp 97.7°F | Resp 16 | Ht 63.0 in | Wt 143.6 lb

## 2015-05-16 DIAGNOSIS — D239 Other benign neoplasm of skin, unspecified: Secondary | ICD-10-CM

## 2015-05-16 DIAGNOSIS — D229 Melanocytic nevi, unspecified: Secondary | ICD-10-CM

## 2015-05-16 NOTE — Patient Instructions (Signed)
Mole Excision Your caregiver has removed (excised) a mole. Most moles are benign (non cancerous). Some moles may change over time and require biopsy (tissue sample) or removal. The mole usually is removed by shaving or cutting it from the skin. You will have stitches in your skin if the mole is large. A small mole, or one that is shaved off, may require only a small bandage. Your caregiver will send a piece of the mole to the laboratory (pathology) to examine it under a microscope for signs of cancer. Make sure you get your biopsy results when you return for your follow-up visit. Call if there is no return visit. HOME CARE INSTRUCTIONS   If the biopsied area was the arm or leg, keep it raised (above the level of your heart) to decrease pain and swelling, if you are having any.  Keep the wound and dressing clean and dry. Clean as necessary.  If the dressing gets wet, remove it slowly and carefully. If it sticks, use warm, soapy water to gently loosen it. Pat the area dry with a clean towel before putting on another dressing.  Return in 7 days or as directed to have your sutures (stitches) removed.  Call in 3 to 4 days, or as directed, for the results of your biopsy. SEEK IMMEDIATE MEDICAL CARE IF:   You have a fever.  You have excess blood soaking through the dressing.  You have increasing pain and swelling in the wound.  You have numbness or swelling below the wound.  You have redness, swelling, pus, a bad smell, or red streaks coming away from the wound, or any other signs of infection. MAKE SURE YOU:   Understand these instructions.  Will watch your condition.  Will get help right away if you are not doing well or get worse. Document Released: 08/03/2000 Document Revised: 10/29/2011 Document Reviewed: 07/09/2007 Exit[employer] Patient Information 2015 [employer]. This information is not intended to replace advice given to you by your health care provider. Make sure you discuss any  questions you have with your health care provider.  

## 2015-05-16 NOTE — Progress Notes (Signed)
Chief Complaint: Patient presents for evaluation of a skin lesion.   Exam: Right breast with 4x4 mm irreg border brown nevus, and 2x2 irreg dark nevus on back of right arm  Anesthesia: marcaine with epi   Procedure Details   The risks, benefits, indications, potential complications, and alternatives were explained to the patient and informed consent obtained.  ELECTRO: The lesion and surrounding area was given sterile prep using alcohol and draped in the usual sterile fashion. A 11 blade was used to excise an elliptical area of skin approximately 1cm by 1cm. The wound was closed with electrocaudry. Antibiotic ointment and a sterile dressing applied. The specimen was sent for pathologic examination. The patient tolerated the procedure well with minimal blood loss.   Condition: Stable  Complications:  None  Diagnosis: Atypical nevus, unspecified D 22.9  Procedure code: 09643 one lesion          83818 between 2-14  Plan: 1. Instructed to keep the wound dry and covered for 24-48 hours and clean thereafter. 2. Warning signs of infection were reviewed.    3. Recommended that the patient use OTC acetaminophen as needed for pain.

## 2015-08-21 NOTE — L&D Delivery Note (Addendum)
Delivery Note Pt labored quickly to complete. She pushed for only 20-3mins. At 5:31 AM a viable female was delivered via Vaginal, Spontaneous Delivery (Presentation: OA;  ).  APGAR:8,9  , ; weight pending .   Placenta status: delivered, intact, shultz , .  Cord:3vc  with the following complications:none .   Anesthesia:  Epidural Episiotomy: None Lacerations: 1st degree;Perineal Suture Repair: vicryl rapide 4-0 Est. Blood Loss (mL): 150  Mom to postpartum.  Baby to Couplet care / Skin to Skin. Desires circ in hospital - discussed  Sherlyn Hay 08/04/2016, 5:49 AM

## 2015-10-28 ENCOUNTER — Encounter: Payer: Self-pay | Admitting: Certified Nurse Midwife

## 2015-10-28 ENCOUNTER — Ambulatory Visit (INDEPENDENT_AMBULATORY_CARE_PROVIDER_SITE_OTHER): Payer: 59 | Admitting: Certified Nurse Midwife

## 2015-10-28 VITALS — BP 102/64 | HR 68 | Resp 16 | Ht 62.25 in | Wt 141.0 lb

## 2015-10-28 DIAGNOSIS — Z01419 Encounter for gynecological examination (general) (routine) without abnormal findings: Secondary | ICD-10-CM

## 2015-10-28 NOTE — Progress Notes (Signed)
25 y.o. G1P1001 Single  Caucasian Fe here for annual exam. Periods scant to light. Had Nexplanon removed last week by provider who inserted it. Not sexually active, so has contraception not needed at this point. Will advise if decides she needs contraception. Will use condoms if needed. Sees Urgent care if needed. No health issues today.  Patient's last menstrual period was 10/04/2015.          Sexually active: No.  The current method of family planning is abstinence.    Exercising: No.  exercise Smoker:  no  Health Maintenance: Pap:  10-14-13 neg MMG:  none Colonoscopy:  none BMD:   none TDaP:  2014 Shingles: no Pneumonia: no Hep C and HIV: both negative 2015 Labs: none Self breast exam: done monthly   reports that she has never smoked. She has never used smokeless tobacco. She reports that she drinks about 1.2 - 1.8 oz of alcohol per week. She reports that she does not use illicit drugs.  Past Medical History  Diagnosis Date  . Anxiety   . Depression   . Anemia     Past Surgical History  Procedure Laterality Date  . Eye surgery      for lazy eye  . Wisdom tooth extraction    . Nexplanon      insertion 2/14, removed 2017    Current Outpatient Prescriptions  Medication Sig Dispense Refill  . ALPRAZolam (XANAX) 0.5 MG tablet Take 1 tablet (0.5 mg total) by mouth 2 (two) times daily as needed for anxiety or sleep. 60 tablet 0  . calcium carbonate (TUMS - DOSED IN MG ELEMENTAL CALCIUM) 500 MG chewable tablet Chew 1 tablet by mouth daily.     No current facility-administered medications for this visit.    Family History  Problem Relation Age of Onset  . Other Neg Hx   . Diabetes Father   . Heart attack Father   . COPD Maternal Grandmother   . Cancer Maternal Grandmother     bone  . Stroke Maternal Grandfather     ROS:  Pertinent items are noted in HPI.  Otherwise, a comprehensive ROS was negative.  Exam:   BP 102/64 mmHg  Pulse 68  Resp 16  Ht 5' 2.25"  (1.581 m)  Wt 141 lb (63.957 kg)  BMI 25.59 kg/m2  LMP 10/04/2015 Height: 5' 2.25" (158.1 cm) Ht Readings from Last 3 Encounters:  10/28/15 5' 2.25" (1.581 m)  05/16/15 5\' 3"  (1.6 m)  05/05/15 5\' 3"  (1.6 m)    General appearance: alert, cooperative and appears stated age Head: Normocephalic, without obvious abnormality, atraumatic Neck: no adenopathy, supple, symmetrical, trachea midline and thyroid normal to inspection and palpation Lungs: clear to auscultation bilaterally Breasts: normal appearance, no masses or tenderness, No nipple retraction or dimpling, No nipple discharge or bleeding, No axillary or supraclavicular adenopathy Heart: regular rate and rhythm Abdomen: soft, non-tender; no masses,  no organomegaly Extremities: extremities normal, atraumatic, no cyanosis or edema Skin: Skin color, texture, turgor normal. No rashes or lesions Lymph nodes: Cervical, supraclavicular, and axillary nodes normal. No abnormal inguinal nodes palpated Neurologic: Grossly normal   Pelvic: External genitalia:  no lesions              Urethra:  normal appearing urethra with no masses, tenderness or lesions              Bartholin's and Skene's: normal  Vagina: normal appearing vagina with normal color and discharge, no lesions              Cervix: normal,non tender,no lesions              Pap taken: No. Bimanual Exam:  Uterus:  normal size, contour, position, consistency, mobility, non-tender and anteverted              Adnexa: normal adnexa and no mass, fullness, tenderness               Rectovaginal: Confirms               Anus:  normal appearance  Chaperone present: yes  A:  Well Woman with normal exam  Contraception recent Nexplanon removal, desires none  P:   Reviewed health and wellness pertinent to exam  Will use condoms if needed or advise if she needs our assistance  Pap smear as above not taken   counseled on breast self exam, STD prevention, HIV risk factors  and prevention, family planning choices, adequate intake of calcium and vitamin D, diet and exercise  return annually or prn  An After Visit Summary was printed and given to the patient.

## 2015-10-28 NOTE — Patient Instructions (Signed)
General topics  Next pap or exam is  due in 1 year Take a Women's multivitamin Take 1200 mg. of calcium daily - prefer dietary If any concerns in interim to call back  Breast Self-Awareness Practicing breast self-awareness may pick up problems early, prevent significant medical complications, and possibly save your life. By practicing breast self-awareness, you can become familiar with how your breasts look and feel and if your breasts are changing. This allows you to notice changes early. It can also offer you some reassurance that your breast health is good. One way to learn what is normal for your breasts and whether your breasts are changing is to do a breast self-exam. If you find a lump or something that was not present in the past, it is best to contact your caregiver right away. Other findings that should be evaluated by your caregiver include nipple discharge, especially if it is bloody; skin changes or reddening; areas where the skin seems to be pulled in (retracted); or new lumps and bumps. Breast pain is seldom associated with cancer (malignancy), but should also be evaluated by a caregiver. BREAST SELF-EXAM The best time to examine your breasts is 5 7 days after your menstrual period is over.  ExitCare Patient Information 2013 [employer].   Exercise to Stay Healthy Exercise helps you become and stay healthy. EXERCISE IDEAS AND TIPS Choose exercises that:  You enjoy.  Fit into your day. You do not need to exercise really hard to be healthy. You can do exercises at a slow or medium level and stay healthy. You can:  Stretch before and after working out.  Try yoga, Pilates, or tai chi.  Lift weights.  Walk fast, swim, jog, run, climb stairs, bicycle, dance, or rollerskate.  Take aerobic classes. Exercises that burn about 150 calories:  Running 1  miles in 15 minutes.  Playing volleyball for 45 to 60 minutes.  Washing and waxing a car for 45 to 60  minutes.  Playing touch football for 45 minutes.  Walking 1  miles in 35 minutes.  Pushing a stroller 1  miles in 30 minutes.  Playing basketball for 30 minutes.  Raking leaves for 30 minutes.  Bicycling 5 miles in 30 minutes.  Walking 2 miles in 30 minutes.  Dancing for 30 minutes.  Shoveling snow for 15 minutes.  Swimming laps for 20 minutes.  Walking up stairs for 15 minutes.  Bicycling 4 miles in 15 minutes.  Gardening for 30 to 45 minutes.  Jumping rope for 15 minutes.  Washing windows or floors for 45 to 60 minutes. Document Released: 09/08/2010 Document Revised: 10/29/2011 Document Reviewed: 09/08/2010 ExitCare Patient Information 2013 [employer].   Other topics ( that may be useful information):    Sexually Transmitted Disease Sexually transmitted disease (STD) refers to any infection that is passed from person to person during sexual activity. This may happen by way of saliva, semen, blood, vaginal mucus, or urine. Common STDs include:  Gonorrhea.  Chlamydia.  Syphilis.  HIV/AIDS.  Genital herpes.  Hepatitis B and C.  Trichomonas.  Human papillomavirus (HPV).  Pubic lice. CAUSES  An STD may be spread by bacteria, virus, or parasite. A person can get an STD by:  Sexual intercourse with an infected person.  Sharing sex toys with an infected person.  Sharing needles with an infected person.  Having intimate contact with the genitals, mouth, or rectal areas of an infected person. SYMPTOMS  Some people may not have any symptoms, but   they can still pass the infection to others. Different STDs have different symptoms. Symptoms include:  Painful or bloody urination.  Pain in the pelvis, abdomen, vagina, anus, throat, or eyes.  Skin rash, itching, irritation, growths, or sores (lesions). These usually occur in the genital or anal area.  Abnormal vaginal discharge.  Penile discharge in men.  Soft, flesh-colored skin growths in the  genital or anal area.  Fever.  Pain or bleeding during sexual intercourse.  Swollen glands in the groin area.  Yellow skin and eyes (jaundice). This is seen with hepatitis. DIAGNOSIS  To make a diagnosis, your caregiver may:  Take a medical history.  Perform a physical exam.  Take a specimen (culture) to be examined.  Examine a sample of discharge under a microscope.  Perform blood test TREATMENT   Chlamydia, gonorrhea, trichomonas, and syphilis can be cured with antibiotic medicine.  Genital herpes, hepatitis, and HIV can be treated, but not cured, with prescribed medicines. The medicines will lessen the symptoms.  Genital warts from HPV can be treated with medicine or by freezing, burning (electrocautery), or surgery. Warts may come back.  HPV is a virus and cannot be cured with medicine or surgery.However, abnormal areas may be followed very closely by your caregiver and may be removed from the cervix, vagina, or vulva through office procedures or surgery. If your diagnosis is confirmed, your recent sexual partners need treatment. This is true even if they are symptom-free or have a negative culture or evaluation. They should not have sex until their caregiver says it is okay. HOME CARE INSTRUCTIONS  All sexual partners should be informed, tested, and treated for all STDs.  Take your antibiotics as directed. Finish them even if you start to feel better.  Only take over-the-counter or prescription medicines for pain, discomfort, or fever as directed by your caregiver.  Rest.  Eat a balanced diet and drink enough fluids to keep your urine clear or pale yellow.  Do not have sex until treatment is completed and you have followed up with your caregiver. STDs should be checked after treatment.  Keep all follow-up appointments, Pap tests, and blood tests as directed by your caregiver.  Only use latex condoms and water-soluble lubricants during sexual activity. Do not use  petroleum jelly or oils.  Avoid alcohol and illegal drugs.  Get vaccinated for HPV and hepatitis. If you have not received these vaccines in the past, talk to your caregiver about whether one or both might be right for you.  Avoid risky sex practices that can break the skin. The only way to avoid getting an STD is to avoid all sexual activity.Latex condoms and dental dams (for oral sex) will help lessen the risk of getting an STD, but will not completely eliminate the risk. SEEK MEDICAL CARE IF:   You have a fever.  You have any new or worsening symptoms. Document Released: 10/27/2002 Document Revised: 10/29/2011 Document Reviewed: 11/03/2010 [facility] Patient Information 2013 [employer].    Domestic Abuse You are being battered or abused if someone close to you hits, pushes, or physically hurts you in any way. You also are being abused if you are forced into activities. You are being sexually abused if you are forced to have sexual contact of any kind. You are being emotionally abused if you are made to feel worthless or if you are constantly threatened. It is important to remember that help is available. No one has the right to abuse you. PREVENTION OF FURTHER  ABUSE  Learn the warning signs of danger. This varies with situations but may include: the use of alcohol, threats, isolation from friends and family, or forced sexual contact. Leave if you feel that violence is going to occur.  If you are attacked or beaten, report it to the police so the abuse is documented. You do not have to press charges. The police can protect you while you or the attackers are leaving. Get the officer's name and badge number and a copy of the report.  Find someone you can trust and tell them what is happening to you: your caregiver, a nurse, clergy member, close friend or family member. Feeling ashamed is natural, but remember that you have done nothing wrong. No one deserves abuse. Document Released:  08/03/2000 Document Revised: 10/29/2011 Document Reviewed: 10/12/2010 ExitCare Patient Information 2013 [employer].    How Much is Too Much Alcohol? Drinking too much alcohol can cause injury, accidents, and health problems. These types of problems can include:   Car crashes.  Falls.  Family fighting (domestic violence).  Drowning.  Fights.  Injuries.  Burns.  Damage to certain organs.  Having a baby with birth defects. ONE DRINK CAN BE TOO MUCH WHEN YOU ARE:  Working.  Pregnant or breastfeeding.  Taking medicines. Ask your doctor.  Driving or planning to drive. If you or someone you know has a drinking problem, get help from a doctor.  Document Released: 06/02/2009 Document Revised: 10/29/2011 Document Reviewed: 06/02/2009 ExitCare Patient Information 2013 [employer].   Smoking Hazards Smoking cigarettes is extremely bad for your health. Tobacco smoke has over 200 known poisons in it. There are over 60 chemicals in tobacco smoke that cause cancer. Some of the chemicals found in cigarette smoke include:   Cyanide.  Benzene.  Formaldehyde.  Methanol (wood alcohol).  Acetylene (fuel used in welding torches).  Ammonia. Cigarette smoke also contains the poisonous gases nitrogen oxide and carbon monoxide.  Cigarette smokers have an increased risk of many serious medical problems and Smoking causes approximately:  90% of all lung cancer deaths in men.  80% of all lung cancer deaths in women.  90% of deaths from chronic obstructive lung disease. Compared with nonsmokers, smoking increases the risk of:  Coronary heart disease by 2 to 4 times.  Stroke by 2 to 4 times.  Men developing lung cancer by 23 times.  Women developing lung cancer by 13 times.  Dying from chronic obstructive lung diseases by 12 times.  . Smoking is the most preventable cause of death and disease in our society.  WHY IS SMOKING ADDICTIVE?  Nicotine is the chemical  agent in tobacco that is capable of causing addiction or dependence.  When you smoke and inhale, nicotine is absorbed rapidly into the bloodstream through your lungs. Nicotine absorbed through the lungs is capable of creating a powerful addiction. Both inhaled and non-inhaled nicotine may be addictive.  Addiction studies of cigarettes and spit tobacco show that addiction to nicotine occurs mainly during the teen years, when young people begin using tobacco products. WHAT ARE THE BENEFITS OF QUITTING?  There are many health benefits to quitting smoking.   Likelihood of developing cancer and heart disease decreases. Health improvements are seen almost immediately.  Blood pressure, pulse rate, and breathing patterns start returning to normal soon after quitting. QUITTING SMOKING   American Lung Association - 1-800-LUNGUSA  American Cancer Society - 1-800-ACS-2345 Document Released: 09/13/2004 Document Revised: 10/29/2011 Document Reviewed: 05/18/2009 ExitCare Patient Information 2013 Exit[employer]   LL[employer].   Stress Management Stress is a state of physical or mental tension that often results from changes in your life or normal routine. Some common causes of stress are:  Death of a loved one.  Injuries or severe illnesses.  Getting fired or changing jobs.  Moving into a new home. Other causes may be:  Sexual problems.  Business or financial losses.  Taking on a large debt.  Regular conflict with someone at home or at work.  Constant tiredness from lack of sleep. It is not just bad things that are stressful. It may be stressful to:  Win the lottery.  Get married.  Buy a new car. The amount of stress that can be easily tolerated varies from person to person. Changes generally cause stress, regardless of the types of change. Too much stress can affect your health. It may lead to physical or emotional problems. Too little stress (boredom) may also become stressful. SUGGESTIONS TO  REDUCE STRESS:  Talk things over with your family and friends. It often is helpful to share your concerns and worries. If you feel your problem is serious, you may want to get help from a professional counselor.  Consider your problems one at a time instead of lumping them all together. Trying to take care of everything at once may seem impossible. List all the things you need to do and then start with the most important one. Set a goal to accomplish 2 or 3 things each day. If you expect to do too many in a single day you will naturally fail, causing you to feel even more stressed.  Do not use alcohol or drugs to relieve stress. Although you may feel better for a short time, they do not remove the problems that caused the stress. They can also be habit forming.  Exercise regularly - at least 3 times per week. Physical exercise can help to relieve that "uptight" feeling and will relax you.  The shortest distance between despair and hope is often a good night's sleep.  Go to bed and get up on time allowing yourself time for appointments without being rushed.  Take a short "time-out" period from any stressful situation that occurs during the day. Close your eyes and take some deep breaths. Starting with the muscles in your face, tense them, hold it for a few seconds, then relax. Repeat this with the muscles in your neck, shoulders, hand, stomach, back and legs.  Take good care of yourself. Eat a balanced diet and get plenty of rest.  Schedule time for having fun. Take a break from your daily routine to relax. HOME CARE INSTRUCTIONS   Call if you feel overwhelmed by your problems and feel you can no longer manage them on your own.  Return immediately if you feel like hurting yourself or someone else. Document Released: 01/30/2001 Document Revised: 10/29/2011 Document Reviewed: 09/22/2007 ExitCare Patient Information 2013 [employer].   

## 2015-10-31 NOTE — Progress Notes (Signed)
Encounter reviewed Jill Jertson, MD   

## 2015-12-30 ENCOUNTER — Inpatient Hospital Stay (HOSPITAL_COMMUNITY): Admission: AD | Admit: 2015-12-30 | Payer: 59 | Source: Ambulatory Visit | Admitting: Obstetrics and Gynecology

## 2016-01-12 LAB — OB RESULTS CONSOLE GC/CHLAMYDIA
CHLAMYDIA, DNA PROBE: NEGATIVE
GC PROBE AMP, GENITAL: NEGATIVE

## 2016-01-12 LAB — OB RESULTS CONSOLE HEPATITIS B SURFACE ANTIGEN: Hepatitis B Surface Ag: NEGATIVE

## 2016-01-12 LAB — OB RESULTS CONSOLE RPR: RPR: NONREACTIVE

## 2016-01-12 LAB — OB RESULTS CONSOLE ABO/RH: RH TYPE: NEGATIVE

## 2016-01-12 LAB — OB RESULTS CONSOLE HIV ANTIBODY (ROUTINE TESTING): HIV: NONREACTIVE

## 2016-01-12 LAB — OB RESULTS CONSOLE RUBELLA ANTIBODY, IGM: Rubella: IMMUNE

## 2016-01-12 LAB — OB RESULTS CONSOLE ANTIBODY SCREEN: Antibody Screen: NEGATIVE

## 2016-02-04 IMAGING — CR DG LUMBAR SPINE COMPLETE 4+V
5 series · 5 of 5 positions shown · non-contrast
Comparison: None.

CLINICAL DATA: Back pain

EXAM:
LUMBAR SPINE - COMPLETE 4+ VIEW

[view not recorded (1 of 5)]
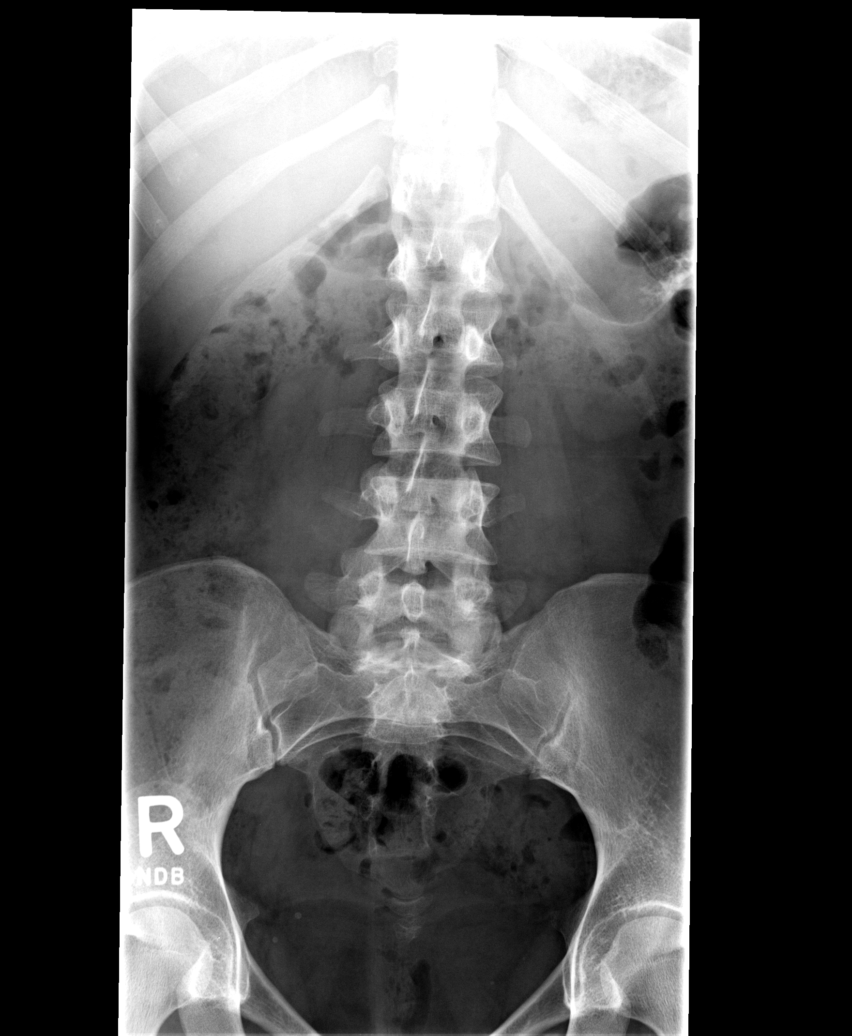

[view not recorded (2 of 5)]
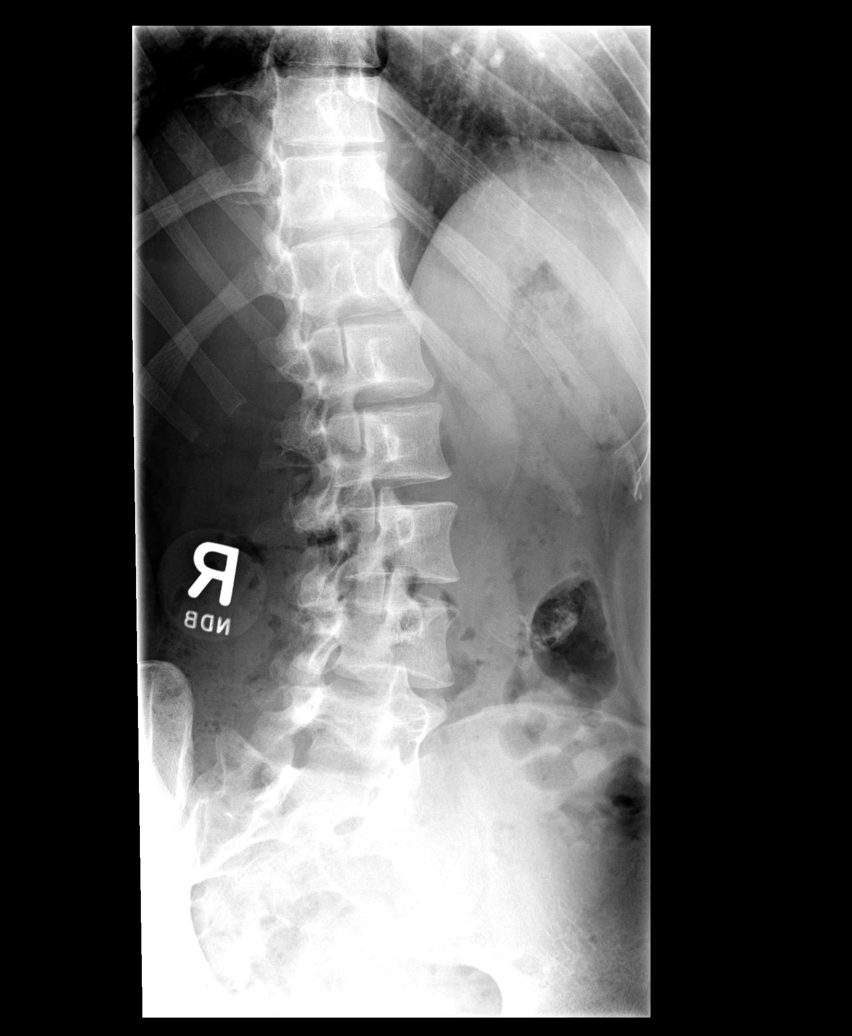

[view not recorded (3 of 5)]
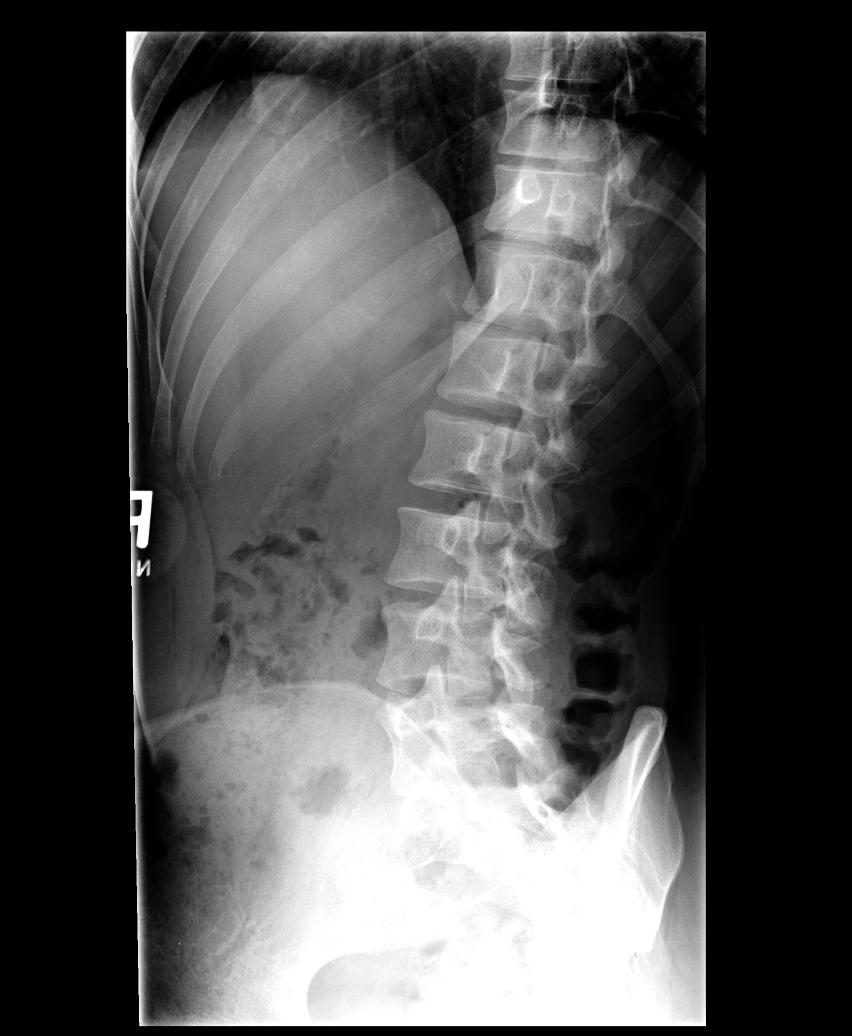

[view not recorded (4 of 5)]
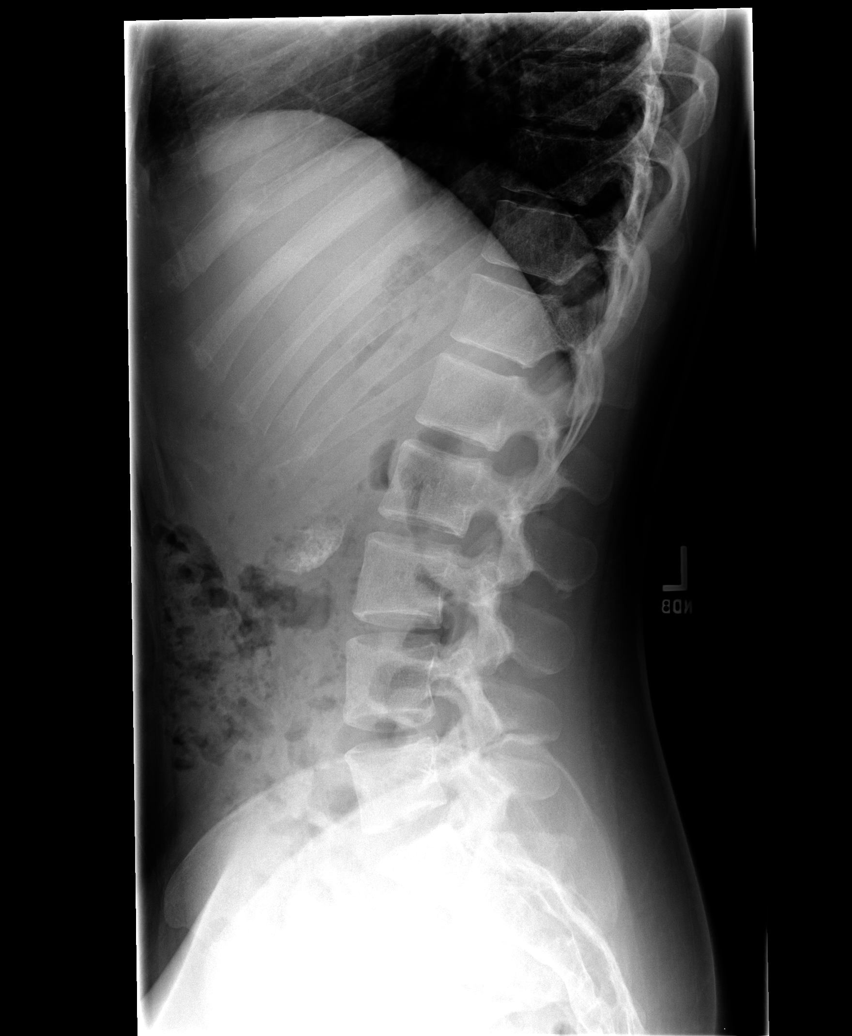

[view not recorded (5 of 5)]
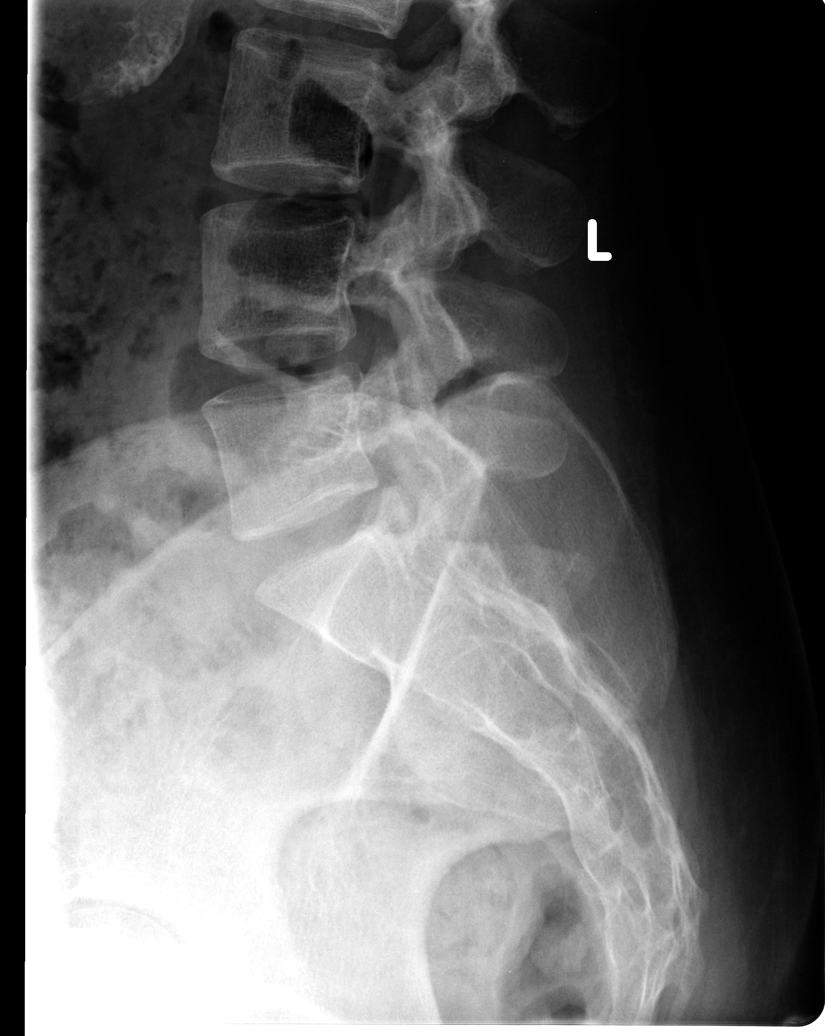

[5 of 5 positions shown; findings below may reference images not displayed]

FINDINGS: There is no evidence of lumbar spine fracture. Alignment is normal.
Intervertebral disc spaces are maintained.
IMPRESSION: Negative.

## 2016-05-04 ENCOUNTER — Encounter: Payer: Self-pay | Admitting: Physician Assistant

## 2016-05-04 ENCOUNTER — Ambulatory Visit (INDEPENDENT_AMBULATORY_CARE_PROVIDER_SITE_OTHER): Payer: 59 | Admitting: Physician Assistant

## 2016-05-04 VITALS — BP 124/62 | HR 92 | Temp 97.7°F | Resp 16 | Ht 62.25 in | Wt 158.4 lb

## 2016-05-04 DIAGNOSIS — E559 Vitamin D deficiency, unspecified: Secondary | ICD-10-CM

## 2016-05-04 DIAGNOSIS — D649 Anemia, unspecified: Secondary | ICD-10-CM

## 2016-05-04 DIAGNOSIS — Z131 Encounter for screening for diabetes mellitus: Secondary | ICD-10-CM

## 2016-05-04 DIAGNOSIS — Z79899 Other long term (current) drug therapy: Secondary | ICD-10-CM | POA: Diagnosis not present

## 2016-05-04 DIAGNOSIS — Z0000 Encounter for general adult medical examination without abnormal findings: Secondary | ICD-10-CM

## 2016-05-04 DIAGNOSIS — Z0001 Encounter for general adult medical examination with abnormal findings: Secondary | ICD-10-CM | POA: Diagnosis not present

## 2016-05-04 DIAGNOSIS — R6889 Other general symptoms and signs: Secondary | ICD-10-CM | POA: Diagnosis not present

## 2016-05-04 DIAGNOSIS — M25562 Pain in left knee: Secondary | ICD-10-CM

## 2016-05-04 DIAGNOSIS — Z13220 Encounter for screening for lipoid disorders: Secondary | ICD-10-CM

## 2016-05-04 DIAGNOSIS — K219 Gastro-esophageal reflux disease without esophagitis: Secondary | ICD-10-CM

## 2016-05-04 DIAGNOSIS — F419 Anxiety disorder, unspecified: Secondary | ICD-10-CM

## 2016-05-04 LAB — VITAMIN B12: Vitamin B-12: 393 pg/mL (ref 200–1100)

## 2016-05-04 LAB — CBC WITH DIFFERENTIAL/PLATELET
BASOS ABS: 0 {cells}/uL (ref 0–200)
Basophils Relative: 0 %
EOS ABS: 73 {cells}/uL (ref 15–500)
Eosinophils Relative: 1 %
HCT: 32.1 % — ABNORMAL LOW (ref 35.0–45.0)
Hemoglobin: 10.4 g/dL — ABNORMAL LOW (ref 11.7–15.5)
LYMPHS PCT: 24 %
Lymphs Abs: 1752 cells/uL (ref 850–3900)
MCH: 29.1 pg (ref 27.0–33.0)
MCHC: 32.4 g/dL (ref 32.0–36.0)
MCV: 89.7 fL (ref 80.0–100.0)
MPV: 10.8 fL (ref 7.5–12.5)
Monocytes Absolute: 438 cells/uL (ref 200–950)
Monocytes Relative: 6 %
Neutro Abs: 5037 cells/uL (ref 1500–7800)
Neutrophils Relative %: 69 %
PLATELETS: 197 10*3/uL (ref 140–400)
RBC: 3.58 MIL/uL — ABNORMAL LOW (ref 3.80–5.10)
RDW: 14.1 % (ref 11.0–15.0)
WBC: 7.3 10*3/uL (ref 3.8–10.8)

## 2016-05-04 LAB — HEPATIC FUNCTION PANEL
ALK PHOS: 34 U/L (ref 33–115)
ALT: 11 U/L (ref 6–29)
AST: 15 U/L (ref 10–30)
Albumin: 3.4 g/dL — ABNORMAL LOW (ref 3.6–5.1)
Bilirubin, Direct: 0.1 mg/dL (ref <=0.2)
Indirect Bilirubin: 0.2 mg/dL (ref 0.2–1.2)
TOTAL PROTEIN: 5.7 g/dL — AB (ref 6.1–8.1)
Total Bilirubin: 0.3 mg/dL (ref 0.2–1.2)

## 2016-05-04 LAB — BASIC METABOLIC PANEL WITH GFR
BUN: 9 mg/dL (ref 7–25)
CO2: 23 mmol/L (ref 20–31)
Calcium: 8.3 mg/dL — ABNORMAL LOW (ref 8.6–10.2)
Chloride: 108 mmol/L (ref 98–110)
Creat: 0.44 mg/dL — ABNORMAL LOW (ref 0.50–1.10)
GFR, Est African American: >89 mL/min (ref >=60)
GFR, Est Non African American: >89 mL/min (ref >=60)
Glucose, Bld: 56 mg/dL — ABNORMAL LOW (ref 65–99)
Potassium: 3.9 mmol/L (ref 3.5–5.3)
Sodium: 139 mmol/L (ref 135–146)

## 2016-05-04 LAB — LIPID PANEL
CHOLESTEROL: 193 mg/dL (ref 125–200)
HDL: 82 mg/dL (ref >=46)
LDL Cholesterol: 93 mg/dL (ref <130)
TRIGLYCERIDES: 90 mg/dL (ref <150)
Total CHOL/HDL Ratio: 2.4 Ratio (ref <=5.0)
VLDL: 18 mg/dL (ref <30)

## 2016-05-04 LAB — TSH: TSH: 0.94 mIU/L

## 2016-05-04 LAB — IRON AND TIBC
%SAT: 26 % (ref 11–50)
IRON: 94 ug/dL (ref 40–190)
TIBC: 367 ug/dL (ref 250–450)
UIBC: 273 ug/dL (ref 125–400)

## 2016-05-04 LAB — FERRITIN: FERRITIN: 19 ng/mL (ref 10–154)

## 2016-05-04 NOTE — Progress Notes (Signed)
Complete Physical  Assessment and Plan: Depression Depression- continue medications, stress management techniques discussed, increase water, good sleep hygiene discussed, increase exercise, and increase veggies.  - TSH  Anxiety continue medications, stress management techniques discussed, increase water, good sleep hygiene discussed, increase exercise, and increase veggies.  - TSH  Anemia, unspecified anemia type - Iron and TIBC - Ferritin - Vitamin B12  Routine general medical examination at a health care facility - CBC with Differential/Platelet - BASIC METABOLIC PANEL WITH GFR - Hepatic function panel - TSH - Lipid panel - Hemoglobin A1c - Insulin, fasting - Magnesium - Iron and TIBC - Ferritin - Vitamin B12  Screening for diabetes mellitus Has family history - Hemoglobin A1c  Screening cholesterol level - Lipid panel  Gastroesophageal reflux disease, esophagitis presence not specified Continue PPI/H2 blocker, diet discussed  Medication management - CBC with Differential/Platelet - BASIC METABOLIC PANEL WITH GFR - Hepatic function panel  Left knee pain/bilateral feet pain Likely chondromalacia/patellafemoral and pes planus Exercises given, get better support for arch, requesting referral to ortho  Discussed med's effects and SE's. Screening labs and tests as requested with regular follow-up as recommended.  HPI 25 y.o. female  presents for a complete physical.  Her blood pressure has been controlled at home, today their BP is BP: 124/62 She does workout. She denies chest pain, shortness of breath, dizziness.  Patient is on Vitamin D supplement.   Lab Results  Component Value Date   VD25OH 55 04/28/2014     . She has been on Lexapro, wellbutrin, zoloft, and celexa in the past which she did not tolerate. She is not on xanax at this time due to pregnancy and states she is doing well. 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  She has had GERD on TUMS PRN and zantac, has been worse since pregnant.   Lab Results  Component Value Date   HGBA1C 5.3 05/05/2015   BMI is Body mass index is 28.74 kg/m., she is working on diet and exercise. Wt Readings from Last 3 Encounters:  05/04/16 158 lb 6.4 oz (71.8 kg)  10/28/15 141 lb (64 kg)  05/16/15 143 lb 9.6 oz (65.1 kg)    Current Medications:  Current Outpatient Prescriptions on File Prior to Visit  Medication Sig Dispense Refill  . calcium carbonate (TUMS - DOSED IN MG ELEMENTAL CALCIUM) 500 MG chewable tablet Chew 1 tablet by mouth daily.    Marland Kitchen ALPRAZolam (XANAX) 0.5 MG tablet Take 1 tablet (0.5 mg total) by mouth 2 (two) times daily as needed for anxiety or sleep. (Patient not taking: Reported on 05/04/2016) 60 tablet 0   No current facility-administered medications on file prior to visit.    Health Maintenance:   Immunization History  Administered Date(s) Administered  . Influenza Split 08/30/2012  . MMR 08/31/2012  . Rho (D) Immune Globulin 08/30/2012  . Tdap 08/30/2012   Tetanus: 2016 Pneumovax: Prevnar 13:  Flu vaccine: 2014 Zostavax: Pap: 09/2014 negative at OB/GYN with negative STD testing.  MGM: N/A DEXA: Colonoscopy: EGD:  Medical History:  Past Medical History:  Diagnosis Date  . Anemia   . Anxiety   . Depression    Allergies No Known Allergies  SURGICAL HISTORY She  has a past surgical history that includes Eye surgery; Wisdom tooth extraction; and  nexplanon. FAMILY HISTORY Her family history includes COPD in her maternal grandmother; Cancer in her maternal grandmother; Diabetes in her father; Heart attack in her father; Stroke in her maternal grandfather. SOCIAL HISTORY She  reports that she has never smoked. She has never used smokeless tobacco. She reports that she drinks about 1.2 -  1.8 oz of alcohol per week . She reports that she does not use drugs.   Review of Systems  Constitutional: Negative.   HENT: Negative.   Eyes: Negative.   Respiratory: Negative.   Cardiovascular: Negative.   Gastrointestinal: Positive for heartburn.  Genitourinary: Negative.   Musculoskeletal: Positive for joint pain (bilateral feet, wrose left 2nd toe x 6 months). Negative for back pain, falls, myalgias and neck pain.  Skin: Negative.   Neurological: Negative.   Endo/Heme/Allergies: Negative.   Psychiatric/Behavioral: Negative for depression, hallucinations, memory loss, substance abuse and suicidal ideas. The patient is not nervous/anxious and does not have insomnia.     Physical Exam: Estimated body mass index is 28.74 kg/m as calculated from the following:   Height as of this encounter: 5' 2.25" (1.581 m).   Weight as of this encounter: 158 lb 6.4 oz (71.8 kg). BP 124/62   Pulse 92   Temp 97.7 F (36.5 C)   Resp 16   Ht 5' 2.25" (1.581 m)   Wt 158 lb 6.4 oz (71.8 kg)   LMP  (LMP Unknown)   SpO2 98%   BMI 28.74 kg/m  General Appearance: Well nourished, in no apparent distress. Eyes: PERRLA, EOMs, conjunctiva no swelling or erythema, normal fundi and vessels. Sinuses: No Frontal/maxillary tenderness ENT/Mouth: Ext aud canals clear, normal light reflex with TMs without erythema, bulging.  Good dentition. No erythema, swelling, or exudate on post pharynx. Tonsils not swollen or erythematous. Hearing normal.  Neck: Supple, thyroid normal. No bruits Respiratory: Respiratory effort normal, BS equal bilaterally without rales, rhonchi, wheezing or stridor. Cardio: RRR without murmurs, rubs or gallops. Brisk peripheral pulses without edema.  Chest: symmetric, with normal excursions and percussion. Breasts: defer Abdomen: Soft, +BS. + LUQ tenderness, neg murphy, no guarding, rebound, hernias, masses, or organomegaly. .  Lymphatics: Non tender without lymphadenopathy.   Genitourinary: defer Musculoskeletal: Full ROM all peripheral extremities,5/5 strength, and normal gait. Left knee without effusion, erythema, warmth, + patellar laxity and grinding, + lateral joint line tenderness, no ligament laxity, good strength. Bilateral feet with good pulses/sensation, + pes planus.  Skin:   Warm, dry without rashes, lesions, ecchymosis.  Neuro: Cranial nerves intact, reflexes equal bilaterally. Normal muscle tone, no cerebellar symptoms. Sensation intact.  Psych: Awake and oriented X 3, normal affect, Insight and Judgment appropriate.    Jade Webster 9:43 AM

## 2016-05-04 NOTE — Patient Instructions (Signed)
Patellofemoral Pain Syndrome Patellofemoral pain syndrome is a condition that involves a softening or breakdown of the tissue (cartilage) on the underside of your kneecap (patella). This causes pain in the front of the knee. The condition is also called runner's knee or chondromalacia patella. Patellofemoral pain syndrome is most common in young adults who are active in sports. Your knee is the largest joint in your body. The patella covers the front of your knee and is attached to muscles above and below your knee. The underside of the patella is covered with a smooth type of cartilage (synovium). The smooth surface helps the patella glide easily when you move your knee. Patellofemoral pain syndrome causes swelling in the joint linings and bone surfaces in your knee.  CAUSES  Patellofemoral pain syndrome can be caused by:  Overuse.  Poor alignment of your knee joints.  Weak leg muscles.  A direct blow to your kneecap. RISK FACTORS You may be at risk for patellofemoral pain syndrome if you:  Do a lot of activities that can wear down your kneecap. These include:  Running.  Squatting.  Climbing stairs.  Start a new physical activity or exercise program.  Wear shoes that do not fit well.  Do not have good leg strength.  Are overweight. SIGNS AND SYMPTOMS  Knee pain is the most common symptom of patellofemoral pain syndrome. This may feel like a dull, aching pain underneath your patella, in the front of your knee. There may be a popping or cracking sound when you move your knee. Pain may get worse with:  Exercise.  Climbing stairs.  Running.  Jumping.  Squatting.  Kneeling.  Sitting for a long time.  Moving or pushing on your patella. DIAGNOSIS  Your health care provider may be able to diagnose patellofemoral pain syndrome from your symptoms and medical history. You may be asked about your recent physical activities and which ones cause knee pain. Your health care  provider may do a physical exam with certain tests to confirm the diagnosis. These may include:  Moving your patella back and forth.  Checking your range of knee motion.  Having you squat or jump to see if you have pain.  Checking the strength of your leg muscles. An MRI of the knee may also be done. TREATMENT  Patellofemoral pain syndrome can usually be treated at home with rest, ice, compression, and elevation (RICE). Other treatments may include:  Nonsteroidal anti-inflammatory drugs (NSAIDs).  Physical therapy to stretch and strengthen your leg muscles.  Shoe inserts (orthotics) to take stress off your knee.  A knee brace or knee support.  Surgery to remove damaged cartilage or move the patella to a better position. The need for surgery is rare. HOME CARE INSTRUCTIONS   Take medicines only as directed by your health care provider.  Rest your knee.  When resting, keep your knee raised above the level of your heart.  Avoid activities that cause knee pain.  Apply ice to the injured area:  Put ice in a plastic bag.  Place a towel between your skin and the bag.  Leave the ice on for 20 minutes, 2-3 times a day.  Use splints, braces, knee supports, or walking aids as directed by your health care provider.  Perform stretching and strengthening exercises as directed by your health care provider or physical therapist.  Keep all follow-up visits as directed by your health care provider. This is important. SEEK MEDICAL CARE IF:   Your symptoms get worse.  You are not improving with home care. MAKE SURE YOU:  Understand these instructions.  Will watch your condition.  Will get help right away if you are not doing well or get worse.   This information is not intended to replace advice given to you by your health care provider. Make sure you discuss any questions you have with your health care provider.   Document Released: 07/25/2009 Document Revised: 08/27/2014  Document Reviewed: 10/26/2013 Elsevier Interactive Patient Education 2016 [place] Having flat feet is a common condition. One foot or both might be affected. People of any age can have flat feet. In fact, everyone is born with them. But most of the time, the foot gradually develops an arch. That is the curve on the bottom of the foot that creates a gap between the foot and the ground. An arch usually develops in childhood. Sometimes, though, an arch never develops and the foot stays flat on the bottom. Other times, an arch develops but later collapses (caves in). That is what gives the condition its nickname, "fallen arches." The medical term for flat feet is pes planus. Some people have flat feet their whole life and have no problems. For others, the condition causes pain and needs to be corrected.  CAUSES   A problem with the foot's soft tissue; tendons and ligaments could be loose.  This can cause what is called flexible flat feet. That means the shape of the foot changes with pressure. When standing on the toes, a curved arch can be seen. When standing on the ground, the foot is flat.  Wear and tear. Sometimes arches simply flatten over time.  Damage to the posterior tibial tendon. This is the tendon that goes from the inside of the ankle to the bones in the middle of the foot. It is the main support for the arch. If the tendon is injured, stretched or torn, the arch might flatten.  Tarsal coalition. With this condition, two or more bones in the foot are joined together (fused ) during development in the womb. This limits movement and can lead to a flat foot. SYMPTOMS   The foot is even with the ground from toe to heel. Your caregiver will look closely at the inside of the foot while you are standing.  Pain along the bottom of the foot. Some people describe the pain as tightness.  Swelling on the inside of the foot or ankle.  Changes in the way you walk (gait).  The  feet lean inward, starting at the ankle (pronation). DIAGNOSIS  To decide if a child or adult has flat feet, a healthcare provider will probably:  Do a physical examination. This might include having the person stand on his or her toes and then stand normally. The caregiver will also hold the foot and put pressure on the foot in different directions.  Check the person's shoes. The pattern of wear on the soles can offer clues.  Order images (pictures) of the foot. They can help identify the cause of any pain. They also will show injuries to bones or tendons that could be causing the condition. The images can come from:  X-rays.  Computed tomography (CT) scan. This combines X-ray and a computer.  Magnetic resonance imaging (MRI). This uses magnets, radio waves and a computer to take a picture of the foot. It is the best technique to evaluate tendons, ligaments and muscles. TREATMENT   Flexible flat feet usually are painless. Most  of the time, gait is not affected. Most children grow out of the condition. Often no treatment is needed. If there is pain, treatment options include:  Orthotics. These are inserts that go in the shoes. They add support and shape to the feet. An orthotic is custom-made from a mold of the foot.  Shoes. Not all shoes are the same. People with flat feet need arch support. However, too much can be painful. It is important to find shoes that offer the right amount of support. Athletes, especially runners, may need to try shoes made just for people with flatter feet.  Medication. For pain, only take over-the-counter medicine for pain, discomfort, as directed by your caregiver.  Rest. If the feet start to hurt, cut back on the exercise which increases the pain. Use common sense.  For damage to the posterior tibial tendon, options include:  Orthotics. Also adding a wedge on the inside edge may help. This can relieve pressure on the tendon.  Ankle brace, boot or cast.  These supports can ease the load on the tendon while it heals.  Surgery. If the tendon is torn, it might need to be repaired.  For tarsal coalition, similar options apply:  Pain medication.  Orthotics.  A cast and crutches. This keeps weight off the foot.  Physical therapy.  Surgery to remove the bone bridge joining the two bones together. PROGNOSIS  In most people, flat feet do not cause pain or problems. People can go about their normal activities. However, if flat feet are painful, they can and should be treated. Treatment usually relieves the pain. HOME CARE INSTRUCTIONS   Take any medications prescribed by the healthcare provider. Follow the directions carefully.  Wear, or make sure a child wears, orthotics or special shoes if this was suggested. Be sure to ask how often and for how long they should be worn.  Do any exercises or therapy treatments that were suggested.  Take notes on when the pain occurs. This will help healthcare providers decide how to treat the condition.  If surgery is needed, be sure to find out if there is anything that should or should not be done before the operation. SEEK MEDICAL CARE IF:   Pain worsens in the foot or lower leg.  Pain disappears after treatment, but then returns.  Walking or simple exercise becomes difficult or causes foot pain.  Orthotics or special shoes are uncomfortable or painful.   This information is not intended to replace advice given to you by your health care provider. Make sure you discuss any questions you have with your health care provider.   Document Released: 06/03/2009 Document Revised: 10/29/2011 Document Reviewed: 02/02/2015 Elsevier Interactive Patient Education [employer].

## 2016-05-05 LAB — HEMOGLOBIN A1C
Hgb A1c MFr Bld: 4.6 % (ref <5.7)
Mean Plasma Glucose: 85 mg/dL

## 2016-05-07 ENCOUNTER — Encounter: Payer: Self-pay | Admitting: Physician Assistant

## 2016-05-30 ENCOUNTER — Encounter (HOSPITAL_COMMUNITY): Payer: Self-pay | Admitting: *Deleted

## 2016-05-30 ENCOUNTER — Inpatient Hospital Stay (HOSPITAL_COMMUNITY)
Admission: AD | Admit: 2016-05-30 | Discharge: 2016-05-30 | Disposition: A | Discharge status: Home / Self Care | Payer: Medicaid Other | Source: Ambulatory Visit | Attending: Obstetrics and Gynecology | Admitting: Obstetrics and Gynecology

## 2016-05-30 DIAGNOSIS — O26893 Other specified pregnancy related conditions, third trimester: Secondary | ICD-10-CM | POA: Diagnosis present

## 2016-05-30 DIAGNOSIS — N898 Other specified noninflammatory disorders of vagina: Secondary | ICD-10-CM

## 2016-05-30 DIAGNOSIS — O479 False labor, unspecified: Secondary | ICD-10-CM

## 2016-05-30 DIAGNOSIS — Z3A29 29 weeks gestation of pregnancy: Secondary | ICD-10-CM | POA: Insufficient documentation

## 2016-05-30 LAB — URINALYSIS, ROUTINE W REFLEX MICROSCOPIC
Bilirubin Urine: NEGATIVE
GLUCOSE, UA: NEGATIVE mg/dL
Hgb urine dipstick: NEGATIVE
Ketones, ur: 15 mg/dL — AB
LEUKOCYTES UA: NEGATIVE
Nitrite: NEGATIVE
PH: 6 (ref 5.0–8.0)
PROTEIN: NEGATIVE mg/dL
Specific Gravity, Urine: >1.03 — ABNORMAL HIGH (ref 1.005–1.030)

## 2016-05-30 LAB — AMNISURE RUPTURE OF MEMBRANE (ROM) NOT AT ARMC: Amnisure ROM: NEGATIVE

## 2016-05-30 NOTE — MAU Note (Signed)
As standing at work, had a gush. Fluid was clear.  Felt like the last time her water broke. Having cramping in RLQ and low back

## 2016-05-30 NOTE — Discharge Instructions (Signed)

## 2016-05-30 NOTE — MAU Provider Note (Signed)
Chief Complaint:  possible srom and Abdominal Cramping   First Provider Initiated Contact with Patient 05/30/16 1750     HPI: Jade Webster is a 25 y.o. G2P1001 at 81w2dwho presents to maternity admissions reporting single gush or fluid while at work.  No further leaking. States feels like it did last labor.  Some hip pain this morning, Occasional cramping.. She reports good fetal movement, denies vaginal bleeding, vaginal itching/burning, urinary symptoms, h/a, dizziness, n/v, diarrhea, constipation or fever/chills.  She denies headache, visual changes or RUQ abdominal pain.  Vaginal Discharge  The patient's primary symptoms include vaginal discharge. The patient's pertinent negatives include no genital itching, genital lesions, genital odor, pelvic pain or vaginal bleeding. This is a new problem. The current episode started today. The problem occurs intermittently. The pain is mild. The problem affects both sides. She is pregnant. Associated symptoms include back pain. Pertinent negatives include no constipation, diarrhea, fever, headaches, nausea or vomiting. The vaginal discharge was clear. There has been no bleeding. She has not been passing clots. She has not been passing tissue. Nothing aggravates the symptoms. She has tried nothing for the symptoms.   RN Note: As standing at work, had a gush. Fluid was clear.  Felt like the last time her water broke. Having cramping in RLQ and low back  Past Medical History: Past Medical History:  Diagnosis Date  . Anemia   . Anxiety   . Depression     Past obstetric history: OB History  Gravida Para Term Preterm AB Living  2 1 1  0 0 1  SAB TAB Ectopic Multiple Live Births  0 0 0 0 1    # Outcome Date GA Lbr Len/2nd Weight Sex Delivery Anes PTL Lv  2 Current           1 Term 08/29/12 [redacted]w[redacted]d 08:00 / 01:34 6 lb 10.5 oz (3.02 kg) F Vag-Spont EPI  LIV      Past Surgical History: Past Surgical History:  Procedure Laterality Date  . EYE  SURGERY     for lazy eye  . nexplanon     insertion 2/14, removed 2017  . WISDOM TOOTH EXTRACTION      Family History: Family History  Problem Relation Age of Onset  . Diabetes Father   . Heart attack Father   . COPD Maternal Grandmother   . Cancer Maternal Grandmother     bone  . Stroke Maternal Grandfather   . Other Neg Hx     Social History: Social History  Substance Use Topics  . Smoking status: Never Smoker  . Smokeless tobacco: Never Used  . Alcohol use 1.2 - 1.8 oz/week    2 - 3 Standard drinks or equivalent per week    Allergies: No Known Allergies  Meds:  Prescriptions Prior to Admission  Medication Sig Dispense Refill Last Dose  . calcium carbonate (TUMS - DOSED IN MG ELEMENTAL CALCIUM) 500 MG chewable tablet Chew 1 tablet by mouth daily as needed for indigestion or heartburn.    Past Week at Unknown time  . Prenatal Vit-Fe Fumarate-FA (PRENATAL MULTIVITAMIN) TABS tablet Take 1 tablet by mouth daily at 12 noon.   05/29/2016 at Unknown time  . ranitidine (ZANTAC 150 MAXIMUM STRENGTH) 150 MG tablet Take 150 mg by mouth 2 (two) times daily.   05/30/2016 at Unknown time    I have reviewed patient's Past Medical Hx, Surgical Hx, Family Hx, Social Hx, medications and allergies.   ROS:  Review of Systems  Constitutional: Negative for fever.  Gastrointestinal: Negative for constipation, diarrhea, nausea and vomiting.  Genitourinary: Positive for vaginal discharge. Negative for pelvic pain.  Musculoskeletal: Positive for back pain.  Neurological: Negative for headaches.   Other systems negative  Physical Exam  Patient Vitals for the past 24 hrs:  BP Temp Temp src Pulse Resp Weight  05/30/16 1727 123/59 98.3 F (36.8 C) Oral 96 16 164 lb 4 oz (74.5 kg)   Constitutional: Well-developed, well-nourished female in no acute distress.  Cardiovascular: normal rate and rhythm Respiratory: normal effort, clear to auscultation bilaterally GI: Abd soft, non-tender,  gravid appropriate for gestational age.   No rebound or guarding. MS: Extremities nontender, no edema, normal ROM Neurologic: Alert and oriented x 4.  GU: Neg CVAT.  PELVIC EXAM: Cervix pink, visually closed, without lesion, moderate white creamy discharge, vaginal walls and external genitalia normal   No pooling, no ferning Dilation: Closed Effacement (%): 30 Presentation: Vertex Exam by:: Hansel Feinstein CNM   FHT:  Baseline 140 , moderate variability, accelerations present, no decelerations Contractions: Irregular    Labs: Results for orders placed or performed during the hospital encounter of 05/30/16 (from the past 24 hour(s))  Urinalysis, Routine w reflex microscopic (not at [facility])     Status: Abnormal   Collection Time: 05/30/16  5:30 PM  Result Value Ref Range   Color, Urine YELLOW YELLOW   APPearance CLEAR CLEAR   Specific Gravity, Urine >1.030 (H) 1.005 - 1.030   pH 6.0 5.0 - 8.0   Glucose, UA NEGATIVE NEGATIVE mg/dL   Hgb urine dipstick NEGATIVE NEGATIVE   Bilirubin Urine NEGATIVE NEGATIVE   Ketones, ur 15 (A) NEGATIVE mg/dL   Protein, ur NEGATIVE NEGATIVE mg/dL   Nitrite NEGATIVE NEGATIVE   Leukocytes, UA NEGATIVE NEGATIVE  Amnisure rupture of membrane (rom)not at [facility]     Status: None   Collection Time: 05/30/16  6:00 PM  Result Value Ref Range   Amnisure ROM NEGATIVE     Imaging:  No results found.  MAU Course/MDM: I have ordered labs and reviewed results.  Amnisure sent due to history of event >>  Negative                      Preterm rupture of the membranes can be a threat to a fetus at this gestational age.  Therefore, these                      assessments were done to confirm the status of the membranes. NST reviewed and found to be reactive and reassuring, category I    Consult Dr Terri Piedra with presentation, exam findings and test results.  Treatments in MAU included Amnisure which was negative.    Assessment: SIUP at [redacted]w[redacted]d Gush of fluid No evidence of  ruptured membranes Reactive FHR tracing, category I  Plan: Discharge home Preterm Labor precautions and fetal kick counts Follow up in Office for prenatal visits and recheck of status  Encouraged to return here or to other Urgent Care/ED if she develops worsening of symptoms, increase in pain, fever, or other concerning symptoms.     Pt stable at time of discharge.  Hansel Feinstein CNM, MSN Certified Nurse-Midwife 05/30/2016 5:50 PM

## 2016-07-19 LAB — OB RESULTS CONSOLE GBS: STREP GROUP B AG: NEGATIVE

## 2016-07-21 ENCOUNTER — Encounter (HOSPITAL_COMMUNITY): Payer: Self-pay | Admitting: *Deleted

## 2016-07-21 ENCOUNTER — Inpatient Hospital Stay (HOSPITAL_COMMUNITY)
Admission: AD | Admit: 2016-07-21 | Discharge: 2016-07-21 | Disposition: A | Discharge status: Home / Self Care | Payer: Medicaid Other | Source: Ambulatory Visit | Attending: Obstetrics and Gynecology | Admitting: Obstetrics and Gynecology

## 2016-07-21 DIAGNOSIS — Z3A36 36 weeks gestation of pregnancy: Secondary | ICD-10-CM | POA: Diagnosis not present

## 2016-07-21 DIAGNOSIS — O26893 Other specified pregnancy related conditions, third trimester: Secondary | ICD-10-CM | POA: Insufficient documentation

## 2016-07-21 LAB — WET PREP, GENITAL
CLUE CELLS WET PREP: NONE SEEN
Sperm: NONE SEEN
Trich, Wet Prep: NONE SEEN
Yeast Wet Prep HPF POC: NONE SEEN

## 2016-07-21 LAB — POCT FERN TEST: POCT Fern Test: NEGATIVE

## 2016-07-21 NOTE — Progress Notes (Signed)
Wet prep sent.   

## 2016-07-21 NOTE — Discharge Instructions (Signed)

## 2016-07-21 NOTE — Progress Notes (Signed)
Written and verbal d/c instructions given and understanding voiced. 

## 2016-07-21 NOTE — Progress Notes (Signed)
Dr Melba Coon notified of pt's admission and status. Aware of neg fern after spec exam, sve, no ctxs, reactive fhr, wet prep normal. Stable for d/c home

## 2016-07-21 NOTE — MAU Provider Note (Signed)
Ms. Jade Webster is a 25 y.o. G2P1001 at [redacted]w[redacted]d  who presents to MAU today complaining of LOF since last night. She denies vaginal bleeding, regular contractions, or complications with the pregnancy. She reports good fetal movement. Patient denies vaginal itching or irritation. The patient was 1 cm dilated in the office last week.   BP 124/71 (BP Location: Right Arm)   Pulse 90   Temp 98 F (36.7 C)   Resp 18   Ht 5\' 2"  (1.575 m)   Wt 174 lb 6.4 oz (79.1 kg)   LMP  (LMP Unknown)   BMI 31.90 kg/m  GENERAL: Well-developed, well-nourished female in no acute distress.  HEAD: Normocephalic, atraumatic.  CHEST: Normal effort of breathing, regular heart rate ABDOMEN: Soft, nontender, nondistended.  PELVIC: Normal external female genitalia. Vagina is pink and rugated.  Moderate amount of thick, yellow discharge.  Negative pooling. Gravid uterus.   EXTREMITIES: No cyanosis, clubbing, or mild non-pitting edema Dilation: 1 Effacement (%): Thick Exam by:: Kerry Hough PA  [name] - negative  Fetal Monitoring:  Baseline: 125 bpm, moderate, 15 x 15 accelerations, no decelerations Contractions: none  MDM Wet prep obtained as discharge concerning for yeast   Results for orders placed or performed during the hospital encounter of 07/21/16 (from the past 24 hour(s))  Wet prep, genital     Status: Abnormal   Collection Time: 07/21/16  6:30 AM  Result Value Ref Range   Yeast Wet Prep HPF POC NONE SEEN NONE SEEN   Trich, Wet Prep NONE SEEN NONE SEEN   Clue Cells Wet Prep HPF POC NONE SEEN NONE SEEN   WBC, Wet Prep HPF POC MANY (A) NONE SEEN   Sperm NONE SEEN   Fern Test     Status: Normal   Collection Time: 07/21/16  6:37 AM  Result Value Ref Range   POCT Fern Test Negative = intact amniotic membranes     A: SIUP at [redacted]w[redacted]d Membranes intact Reactive NST   P: Report given to RN to contact MD on call for further instructions  Luvenia Redden, PA-C 07/21/2016 6:37 AM

## 2016-07-21 NOTE — MAU Note (Signed)
Leaking fld since 1930 on occ. More frequent since 2230. Clear fld. Did not wear pad to hospital. Some cramping in hips.

## 2016-08-01 ENCOUNTER — Telehealth (HOSPITAL_COMMUNITY): Payer: Self-pay | Admitting: *Deleted

## 2016-08-01 NOTE — Telephone Encounter (Signed)
Preadmission screen  

## 2016-08-03 ENCOUNTER — Encounter (HOSPITAL_COMMUNITY): Payer: Self-pay | Admitting: *Deleted

## 2016-08-04 ENCOUNTER — Inpatient Hospital Stay (HOSPITAL_COMMUNITY)
Admission: AD | Admit: 2016-08-04 | Discharge: 2016-08-05 | DRG: 775 | Disposition: A | Discharge status: Home / Self Care | Payer: Medicaid Other | Source: Ambulatory Visit | Attending: Obstetrics and Gynecology | Admitting: Obstetrics and Gynecology

## 2016-08-04 ENCOUNTER — Encounter (HOSPITAL_COMMUNITY): Payer: Self-pay | Admitting: *Deleted

## 2016-08-04 ENCOUNTER — Inpatient Hospital Stay (HOSPITAL_COMMUNITY): Payer: Medicaid Other | Admitting: Anesthesiology

## 2016-08-04 DIAGNOSIS — O4290 Premature rupture of membranes, unspecified as to length of time between rupture and onset of labor, unspecified weeks of gestation: Secondary | ICD-10-CM

## 2016-08-04 DIAGNOSIS — Z3490 Encounter for supervision of normal pregnancy, unspecified, unspecified trimester: Secondary | ICD-10-CM

## 2016-08-04 DIAGNOSIS — Z823 Family history of stroke: Secondary | ICD-10-CM

## 2016-08-04 DIAGNOSIS — Z833 Family history of diabetes mellitus: Secondary | ICD-10-CM

## 2016-08-04 DIAGNOSIS — Z3A38 38 weeks gestation of pregnancy: Secondary | ICD-10-CM

## 2016-08-04 DIAGNOSIS — O4292 Full-term premature rupture of membranes, unspecified as to length of time between rupture and onset of labor: Secondary | ICD-10-CM | POA: Diagnosis present

## 2016-08-04 DIAGNOSIS — Z8249 Family history of ischemic heart disease and other diseases of the circulatory system: Secondary | ICD-10-CM

## 2016-08-04 LAB — POCT FERN TEST: POCT Fern Test: POSITIVE

## 2016-08-04 LAB — CBC
HCT: 31.2 % — ABNORMAL LOW (ref 36.0–46.0)
Hemoglobin: 10.6 g/dL — ABNORMAL LOW (ref 12.0–15.0)
MCH: 29.1 pg (ref 26.0–34.0)
MCHC: 34 g/dL (ref 30.0–36.0)
MCV: 85.7 fL (ref 78.0–100.0)
PLATELETS: 160 10*3/uL (ref 150–400)
RBC: 3.64 MIL/uL — ABNORMAL LOW (ref 3.87–5.11)
RDW: 13.4 % (ref 11.5–15.5)
WBC: 11.7 10*3/uL — AB (ref 4.0–10.5)

## 2016-08-04 LAB — TYPE AND SCREEN
ABO/RH(D): O NEG
Antibody Screen: NEGATIVE

## 2016-08-04 MED ORDER — SENNOSIDES-DOCUSATE SODIUM 8.6-50 MG PO TABS
2.0000 | ORAL_TABLET | ORAL | Status: DC
  Administered 2016-08-04: 2 via ORAL
  Filled 2016-08-04: qty 2

## 2016-08-04 MED ORDER — LIDOCAINE HCL (PF) 1 % IJ SOLN
30.0000 mL | INTRAMUSCULAR | Status: AC | PRN
  Administered 2016-08-04 (×2): 30 mL via SUBCUTANEOUS
  Filled 2016-08-04: qty 30

## 2016-08-04 MED ORDER — PHENYLEPHRINE 40 MCG/ML (10ML) SYRINGE FOR IV PUSH (FOR BLOOD PRESSURE SUPPORT)
80.0000 ug | PREFILLED_SYRINGE | INTRAVENOUS | Status: DC | PRN
  Filled 2016-08-04: qty 5

## 2016-08-04 MED ORDER — EPHEDRINE 5 MG/ML INJ
10.0000 mg | INTRAVENOUS | Status: DC | PRN
  Filled 2016-08-04: qty 4

## 2016-08-04 MED ORDER — OXYTOCIN BOLUS FROM INFUSION
500.0000 mL | Freq: Once | INTRAVENOUS | Status: AC
  Administered 2016-08-04: 500 mL via INTRAVENOUS

## 2016-08-04 MED ORDER — BUTORPHANOL TARTRATE 1 MG/ML IJ SOLN
1.0000 mg | INTRAMUSCULAR | Status: DC | PRN
  Administered 2016-08-04: 1 mg via INTRAVENOUS
  Filled 2016-08-04: qty 1

## 2016-08-04 MED ORDER — ONDANSETRON HCL 4 MG/2ML IJ SOLN: 4.0000 mg | Freq: Four times a day (QID) | INTRAMUSCULAR | Status: DC | PRN

## 2016-08-04 MED ORDER — IBUPROFEN 600 MG PO TABS
600.0000 mg | ORAL_TABLET | Freq: Four times a day (QID) | ORAL | Status: DC
  Administered 2016-08-04 – 2016-08-05 (×5): 600 mg via ORAL
  Filled 2016-08-04 (×5): qty 1

## 2016-08-04 MED ORDER — OXYCODONE-ACETAMINOPHEN 5-325 MG PO TABS: 2.0000 | ORAL_TABLET | ORAL | Status: DC | PRN

## 2016-08-04 MED ORDER — ONDANSETRON HCL 4 MG PO TABS: 4.0000 mg | ORAL_TABLET | ORAL | Status: DC | PRN

## 2016-08-04 MED ORDER — LACTATED RINGERS IV SOLN: 500.0000 mL | Freq: Once | INTRAVENOUS | Status: DC

## 2016-08-04 MED ORDER — PRENATAL MULTIVITAMIN CH
1.0000 | ORAL_TABLET | Freq: Every day | ORAL | Status: DC
  Administered 2016-08-04 – 2016-08-05 (×2): 1 via ORAL
  Filled 2016-08-04 (×2): qty 1

## 2016-08-04 MED ORDER — SIMETHICONE 80 MG PO CHEW: 80.0000 mg | CHEWABLE_TABLET | ORAL | Status: DC | PRN

## 2016-08-04 MED ORDER — SOD CITRATE-CITRIC ACID 500-334 MG/5ML PO SOLN: 30.0000 mL | ORAL | Status: DC | PRN

## 2016-08-04 MED ORDER — WITCH HAZEL-GLYCERIN EX PADS: 1.0000 "application " | MEDICATED_PAD | CUTANEOUS | Status: DC | PRN

## 2016-08-04 MED ORDER — ONDANSETRON HCL 4 MG/2ML IJ SOLN
4.0000 mg | INTRAMUSCULAR | Status: DC | PRN
Start: 2016-08-04 — End: 2016-08-05

## 2016-08-04 MED ORDER — DIPHENHYDRAMINE HCL 25 MG PO CAPS: 25.0000 mg | ORAL_CAPSULE | Freq: Four times a day (QID) | ORAL | Status: DC | PRN

## 2016-08-04 MED ORDER — LACTATED RINGERS IV SOLN: 500.0000 mL | INTRAVENOUS | Status: DC | PRN

## 2016-08-04 MED ORDER — COCONUT OIL OIL: 1.0000 "application " | TOPICAL_OIL | Status: DC | PRN

## 2016-08-04 MED ORDER — DIBUCAINE 1 % RE OINT: 1.0000 "application " | TOPICAL_OINTMENT | RECTAL | Status: DC | PRN

## 2016-08-04 MED ORDER — OXYTOCIN 40 UNITS IN LACTATED RINGERS INFUSION - SIMPLE MED
2.5000 [IU]/h | INTRAVENOUS | Status: DC
  Filled 2016-08-04: qty 1000

## 2016-08-04 MED ORDER — DIPHENHYDRAMINE HCL 50 MG/ML IJ SOLN: 12.5000 mg | INTRAMUSCULAR | Status: DC | PRN

## 2016-08-04 MED ORDER — PHENYLEPHRINE 40 MCG/ML (10ML) SYRINGE FOR IV PUSH (FOR BLOOD PRESSURE SUPPORT)
80.0000 ug | PREFILLED_SYRINGE | INTRAVENOUS | Status: DC | PRN
  Filled 2016-08-04: qty 10
  Filled 2016-08-04: qty 5

## 2016-08-04 MED ORDER — TETANUS-DIPHTH-ACELL PERTUSSIS 5-2.5-18.5 LF-MCG/0.5 IM SUSP: 0.5000 mL | Freq: Once | INTRAMUSCULAR | Status: DC

## 2016-08-04 MED ORDER — BENZOCAINE-MENTHOL 20-0.5 % EX AERO: 1.0000 "application " | INHALATION_SPRAY | CUTANEOUS | Status: DC | PRN

## 2016-08-04 MED ORDER — FENTANYL 2.5 MCG/ML BUPIVACAINE 1/10 % EPIDURAL INFUSION (WH - ANES)
14.0000 mL/h | INTRAMUSCULAR | Status: DC | PRN
  Administered 2016-08-04: 14 mL/h via EPIDURAL
  Filled 2016-08-04: qty 100

## 2016-08-04 MED ORDER — LIDOCAINE HCL (PF) 1 % IJ SOLN
INTRAMUSCULAR | Status: DC | PRN
  Administered 2016-08-04: 5 mL via EPIDURAL
  Administered 2016-08-04: 8 mL via EPIDURAL

## 2016-08-04 MED ORDER — OXYCODONE-ACETAMINOPHEN 5-325 MG PO TABS: 1.0000 | ORAL_TABLET | ORAL | Status: DC | PRN

## 2016-08-04 MED ORDER — ZOLPIDEM TARTRATE 5 MG PO TABS: 5.0000 mg | ORAL_TABLET | Freq: Every evening | ORAL | Status: DC | PRN

## 2016-08-04 MED ORDER — ACETAMINOPHEN 325 MG PO TABS
650.0000 mg | ORAL_TABLET | ORAL | Status: DC | PRN
Start: 2016-08-04 — End: 2016-08-04

## 2016-08-04 MED ORDER — ACETAMINOPHEN 325 MG PO TABS
650.0000 mg | ORAL_TABLET | ORAL | Status: DC | PRN
  Administered 2016-08-04 (×2): 650 mg via ORAL
  Filled 2016-08-04 (×2): qty 2

## 2016-08-04 MED ORDER — LACTATED RINGERS IV SOLN
INTRAVENOUS | Status: DC
  Administered 2016-08-04: 02:00:00 via INTRAVENOUS

## 2016-08-04 NOTE — MAU Note (Signed)
Pt reports her water broke around 0050. Clear fluid reports. Having ctx off and on. Good fetal movement felt

## 2016-08-04 NOTE — Anesthesia Procedure Notes (Signed)
Epidural Patient location during procedure: OB Start time: 08/04/2016 3:42 AM End time: 08/04/2016 3:46 AM  Staffing Anesthesiologist: Lyn Hollingshead Performed: anesthesiologist   Preanesthetic Checklist Completed: patient identified, surgical consent, pre-op evaluation, timeout performed, IV checked, risks and benefits discussed and monitors and equipment checked  Epidural Patient position: sitting Prep: site prepped and draped and DuraPrep Patient monitoring: continuous pulse ox and blood pressure Approach: midline Location: L3-L4 Injection technique: LOR air  Needle:  Needle type: Tuohy  Needle gauge: 17 G Needle length: 9 cm and 9 Needle insertion depth: 7 cm Catheter type: closed end flexible Catheter size: 19 Gauge Catheter at skin depth: 11 cm Test dose: negative and Other  Assessment Sensory level: T9 Events: blood not aspirated, injection not painful, no injection resistance, negative IV test and no paresthesia  Additional Notes Reason for block:procedure for pain

## 2016-08-04 NOTE — Lactation Note (Signed)
This note was copied from a baby's chart. Lactation Consultation Note  Patient Name: Jade Webster [id number] Date: 08/04/2016 Reason for consult: Initial assessment Baby at 51 hr of life. Mom was using her personal DEBP when lactation entered. She a some colostrum pumped, given curved tip syringe and spoon along with a demonstration on feeding back expressed milk. She bf her older child with no issues for the first 3 wk then the baby started getting fussy at the breast and she was "not getting any milk when I pumped". She started formula and her milk "completed dried up". She would like to bf longer with this baby. She was pumping to "get the milk flowing to make it easier for him". Mom has small, wide spaced breast, that are cone shaped, with a large (compaired to the rest of the breast) raised areola, and erect nipple. She has a vertical compression stripe on the L nipple. There is a scrape on the R nipple from mom's bractlet, not from latching baby. She was holding baby in cradle position, adjusted her to cross cradle and she reported a more comfortable latch. She was having a lot of cramping while baby was feeding, RN given report. Discussed baby behavior, feeding frequency, baby belly size, voids, wt loss, breast changes, and nipple care. Demonstrated manual expression, colostrum noted bilaterally. Given lactation handouts. Aware of OP services and support group.      Maternal Data Has patient been taught Hand Expression?: Yes Does the patient have breastfeeding experience prior to this delivery?: Yes  Feeding Feeding Type: Breast Fed  LATCH Score/Interventions Latch: Grasps breast easily, tongue down, lips flanged, rhythmical sucking.  Audible Swallowing: None Intervention(s): Hand expression;Skin to skin  Type of Nipple: Everted at rest and after stimulation  Comfort (Breast/Nipple): Filling, red/small blisters or bruises, mild/mod discomfort  Problem noted: Mild/Moderate  discomfort;Cracked, bleeding, blisters, bruises Interventions  (Cracked/bleeding/bruising/blister): Expressed breast milk to nipple Interventions (Mild/moderate discomfort): Comfort gels  Hold (Positioning): Assistance needed to correctly position infant at breast and maintain latch. Intervention(s): Position options;Support Pillows  LATCH Score: 6  Lactation Tools Discussed/Used WIC Program: No Pump Review: Milk Storage (frequency and cleaning) Initiated by:: mom Date initiated:: 08/04/16   Consult Status Consult Status: Follow-up Date: 08/05/16 Follow-up type: In-patient    Denzil Hughes 08/04/2016, 8:57 PM

## 2016-08-04 NOTE — Anesthesia Postprocedure Evaluation (Signed)
Anesthesia Post Note  Patient: Jade Webster  Procedure(s) Performed: * No procedures listed *  Patient location during evaluation: Mother Baby Anesthesia Type: Epidural Level of consciousness: awake and alert, oriented and patient cooperative Pain management: pain level controlled Vital Signs Assessment: post-procedure vital signs reviewed and stable Respiratory status: spontaneous breathing Cardiovascular status: stable Postop Assessment: no headache, epidural receding, patient able to bend at knees and no signs of nausea or vomiting Anesthetic complications: no Comments: Pain score 4.  Pt to receive motrin shortly after interview.     Last Vitals:  Vitals:   08/04/16 0850 08/04/16 1230  BP: 118/62 117/62  Pulse: 82 86  Resp: 16 16  Temp: 36.9 C 36.7 C    Last Pain:  Vitals:   08/04/16 1315  TempSrc:   PainSc: Asleep   Pain Goal: Patients Stated Pain Goal: 2 (08/04/16 1550)               Rico Sheehan

## 2016-08-04 NOTE — Progress Notes (Signed)
Post Partum Day 0 Subjective: no complaints, up ad lib, voiding, tolerating PO and nl lochia, pain controlled  Objective: Blood pressure 136/72, pulse 81, temperature 98.5 F (36.9 C), temperature source Oral, resp. rate 18, height 5\' 2"  (1.575 m), weight 79.4 kg (175 lb), SpO2 99 %, unknown if currently breastfeeding.  Physical Exam:  General: alert and no distress Lochia: appropriate Uterine Fundus: firm   Recent Labs  08/04/16 0208  HGB 10.6*  HCT 31.2*    Assessment/Plan: Breastfeeding and Lactation consult.  Routine PP care.     LOS: 0 days   Bovard-Stuckert, Tish Begin 08/04/2016, 8:42 AM

## 2016-08-04 NOTE — H&P (Signed)
Jade Webster is a 25 y.o. female presenting for complaint of LOF. Pt was noted to be grossly ruptured with clear fluid. Cervix 3/100/-2.  Pts dating is based on lmp and confirmed with 8wk [place] .First trimester and essential panel screen both neg. Pt had [place] for s<d at 34 weeks and was AGA.   OB History    Gravida Para Term Preterm AB Living   2 1 1  0 0 1   SAB TAB Ectopic Multiple Live Births   0 0 0 0 1     Past Medical History:  Diagnosis Date  . Anemia   . Anxiety   . Depression    Past Surgical History:  Procedure Laterality Date  . EYE SURGERY     for lazy eye  . nexplanon     insertion 2/14, removed 2017  . WISDOM TOOTH EXTRACTION     Family History: family history includes COPD in her maternal grandmother; Cancer in her father and maternal grandmother; Diabetes in her father; Heart attack in her father; Stroke in her maternal grandfather. Social History:  reports that she has never smoked. She has never used smokeless tobacco. She reports that she drinks about 1.2 - 1.8 oz of alcohol per week . She reports that she does not use drugs.     Maternal Diabetes: No Genetic Screening: Normal Maternal Ultrasounds/Referrals: Normal Fetal Ultrasounds or other Referrals:  None Maternal Substance Abuse:  No Significant Maternal Medications:  None Significant Maternal Lab Results:  Lab values include: Group B Strep negative Other Comments:  None  Review of Systems  Constitutional: Negative for chills, fever, malaise/fatigue and weight loss.  Eyes: Negative for blurred vision.  Respiratory: Negative for cough.   Cardiovascular: Negative for chest pain.  Gastrointestinal: Positive for abdominal pain. Negative for heartburn, nausea and vomiting.  Genitourinary: Negative for dysuria.  Musculoskeletal: Positive for back pain. Negative for myalgias.  Skin: Negative for itching and rash.  Neurological: Negative for dizziness and headaches.  Endo/Heme/Allergies: Does not  bruise/bleed easily.  Psychiatric/Behavioral: Negative for depression, hallucinations, substance abuse and suicidal ideas. The patient is nervous/anxious.    Maternal Medical History:  Reason for admission: Rupture of membranes.  Nausea.  Contractions: Onset was 3-5 hours ago.   Frequency: regular.   Perceived severity is moderate.    Fetal activity: Perceived fetal activity is normal.   Last perceived fetal movement was within the past hour.    Prenatal complications: no prenatal complications Prenatal Complications - Diabetes: none.    Dilation: 3 Effacement (%): 100 Station: -1 Exam by:: K.Wilosn,RN Blood pressure 125/76, pulse 78, temperature 98.1 F (36.7 C), temperature source Oral, resp. rate 18. Maternal Exam:  Uterine Assessment: Contraction strength is moderate.  Contraction frequency is regular.   Abdomen: Estimated fetal weight is AGA.   Fetal presentation: vertex  Introitus: Normal vulva. Normal vagina.  Ferning test: positive.  Nitrazine test: positive. Amniotic fluid character: clear.  Pelvis: adequate for delivery.   Cervix: Cervix evaluated by digital exam.     Physical Exam  Constitutional: She is oriented to person, place, and time. She appears well-developed and well-nourished.  HENT:  Head: Normocephalic.  Neck: Normal range of motion.  Cardiovascular: Normal rate.   Respiratory: Effort normal.  GI: Soft.  Genitourinary: Vagina normal and uterus normal.  Musculoskeletal: Normal range of motion.  Neurological: She is alert and oriented to person, place, and time.  Skin: Skin is warm and dry.  Psychiatric: She has a normal mood and  affect. Her behavior is normal. Judgment and thought content normal.    Prenatal labs: ABO, Rh: O/Negative/-- (05/25 0000) Antibody: Negative (05/25 0000) Rubella: Immune (05/25 0000) RPR: Nonreactive (05/25 0000)  HBsAg: Negative (05/25 0000)  HIV: Non-reactive (05/25 0000)  GBS: Negative (11/30 0000)    Assessment/Plan: G2P1001 at 77 5/7wks with SROM and in active labor Admit for expectant mgmt If indicated start pitocin Pain control prn Anticipate svd  Jade Webster 08/04/2016, 2:03 AM

## 2016-08-04 NOTE — Anesthesia Preprocedure Evaluation (Signed)
Anesthesia Evaluation  Patient identified by MRN, date of birth, ID band Patient awake    Reviewed: Allergy & Precautions, H&P , NPO status , Patient's Chart, lab work & pertinent test results  Airway Mallampati: I  TM Distance: >3 FB Neck ROM: full    Dental no notable dental hx.    Pulmonary neg pulmonary ROS,    Pulmonary exam normal        Cardiovascular negative cardio ROS Normal cardiovascular exam     Neuro/Psych negative neurological ROS     GI/Hepatic Neg liver ROS,   Endo/Other  negative endocrine ROS  Renal/GU negative Renal ROS     Musculoskeletal   Abdominal (+) + obese,   Peds  Hematology   Anesthesia Other Findings   Reproductive/Obstetrics (+) Pregnancy                             Anesthesia Physical Anesthesia Plan  ASA: II  Anesthesia Plan: Epidural   Post-op Pain Management:    Induction:   Airway Management Planned:   Additional Equipment:   Intra-op Plan:   Post-operative Plan:   Informed Consent: I have reviewed the patients History and Physical, chart, labs and discussed the procedure including the risks, benefits and alternatives for the proposed anesthesia with the patient or authorized representative who has indicated his/her understanding and acceptance.     Plan Discussed with:   Anesthesia Plan Comments:         Anesthesia Quick Evaluation

## 2016-08-05 ENCOUNTER — Ambulatory Visit: Payer: Self-pay

## 2016-08-05 LAB — CBC
HCT: 27.3 % — ABNORMAL LOW (ref 36.0–46.0)
HEMOGLOBIN: 9.4 g/dL — AB (ref 12.0–15.0)
MCH: 30.5 pg (ref 26.0–34.0)
MCHC: 34.4 g/dL (ref 30.0–36.0)
MCV: 88.6 fL (ref 78.0–100.0)
Platelets: 141 10*3/uL — ABNORMAL LOW (ref 150–400)
RBC: 3.08 MIL/uL — AB (ref 3.87–5.11)
RDW: 13.9 % (ref 11.5–15.5)
WBC: 11.8 10*3/uL — ABNORMAL HIGH (ref 4.0–10.5)

## 2016-08-05 LAB — RPR: RPR: NONREACTIVE

## 2016-08-05 MED ORDER — PRENATAL GUMMIES/DHA & FA 0.4-32.5 MG PO CHEW: 2.0000 | CHEWABLE_TABLET | Freq: Every evening | ORAL | 5 refills | Status: DC

## 2016-08-05 MED ORDER — IBUPROFEN 800 MG PO TABS: 800.0000 mg | ORAL_TABLET | Freq: Three times a day (TID) | ORAL | 1 refills | Status: DC | PRN

## 2016-08-05 MED ORDER — PRENATAL GUMMIES/DHA & FA 0.4-32.5 MG PO CHEW: 2.0000 | CHEWABLE_TABLET | Freq: Every evening | ORAL | 0 refills | Status: DC

## 2016-08-05 NOTE — Progress Notes (Addendum)
Post Partum Day 1 Subjective: no complaints, up ad lib, voiding, tolerating PO and nl lochia, pain controlled.  Pt requests discharge today.    Objective: Blood pressure 99/67, pulse 62, temperature 97.8 F (36.6 C), resp. rate 18, height 5\' 2"  (1.575 m), weight 79.4 kg (175 lb), SpO2 99 %, unknown if currently breastfeeding.  Physical Exam:  General: alert and no distress Lochia: appropriate Uterine Fundus: firm   Recent Labs  08/04/16 0208 08/05/16 0528  HGB 10.6* 9.4*  HCT 31.2* 27.3*    Assessment/Plan: Plan for discharge tomorrow.  Routine care.  Circ today.  Breastfeeding.  Discharge with Motrin and PNV, f/u 6 weeks.     LOS: 1 day   Bovard-Stuckert, Wacey Zieger 08/05/2016, 8:17 AM

## 2016-08-05 NOTE — Progress Notes (Signed)
MOB was referred for history of depression/anxiety. * Referral screened out by Clinical Social Worker because none of the following criteria appear to apply: ~ History of anxiety/depression during this pregnancy, or of post-partum depression. ~ Diagnosis of anxiety and/or depression within last 3 years OR * MOB's symptoms currently being treated with medication and/or therapy. Please contact the Clinical Social Worker if needs arise, or if MOB requests.  Chart notes depression after sister's death in 11-28-11.

## 2016-08-05 NOTE — Discharge Summary (Signed)
OB Discharge Summary     Patient Name: Jade Webster DOB: Mar 28, 1991 MRN: [id number]  Date of admission: 08/04/2016 Delivering MD: Carlynn Purl [facility]   Date of discharge: 08/05/2016  Admitting diagnosis: 3 weeks water broke, contractions Intrauterine pregnancy: [redacted]w[redacted]d     Secondary diagnosis:  Active Problems:   Term pregnancy   SVD (spontaneous vaginal delivery)   Delayed delivery after SROM (spontaneous rupture of membranes)   Postpartum care following vaginal delivery  Additional problems: N/A     Discharge diagnosis: Term Pregnancy Delivered                                                                                                Post partum procedures:none  Augmentation: N/A  Complications: None  Hospital course:  Onset of Labor With Vaginal Delivery     25 y.o. yo [id number] at [redacted]w[redacted]d was admitted in Active Labor on 08/04/2016. Patient had an uncomplicated labor course as follows:  Membrane Rupture Time/Date: 12:50 AM ,08/04/2016   Intrapartum Procedures: Episiotomy: None [1]                                         Lacerations:  1st degree [2];Perineal [11]  Patient had a delivery of a Viable infant. 08/04/2016  Information for the patient's newborn:  Kailie, Kuipers [id number]  Delivery Method: Vaginal, Spontaneous Delivery (Filed from Delivery Summary)    Pateint had an uncomplicated postpartum course.  She is ambulating, tolerating a regular diet, passing flatus, and urinating well. Patient is discharged home in stable condition on 08/05/16.    Physical exam Vitals:   08/04/16 0747 08/04/16 0850 08/04/16 1230 08/05/16 0534  BP: 136/72 118/62 117/62 99/67  Pulse: 81 82 86 62  Resp: 18 16 16 18   Temp: 98.5 F (36.9 C) 98.5 F (36.9 C) 98 F (36.7 C) 97.8 F (36.6 C)  TempSrc: Oral Oral Oral   SpO2: 99% 99% 99%   Weight:      Height:       General: alert and no distress Lochia: appropriate Uterine Fundus: firm  Labs: Lab Results   Component Value Date   WBC 11.8 (H) 08/05/2016   HGB 9.4 (L) 08/05/2016   HCT 27.3 (L) 08/05/2016   MCV 88.6 08/05/2016   PLT 141 (L) 08/05/2016   CMP Latest Ref Rng & Units 05/04/2016  Glucose 65 - 99 mg/dL 56(L)  BUN 7 - 25 mg/dL 9  Creatinine 0.50 - 1.10 mg/dL 0.44(L)  Sodium 135 - 146 mmol/L 139  Potassium 3.5 - 5.3 mmol/L 3.9  Chloride 98 - 110 mmol/L 108  CO2 20 - 31 mmol/L 23  Calcium 8.6 - 10.2 mg/dL 8.3(L)  Total Protein 6.1 - 8.1 g/dL 5.7(L)  Total Bilirubin 0.2 - 1.2 mg/dL 0.3  Alkaline Phos 33 - 115 U/L 34  AST 10 - 30 U/L 15  ALT 6 - 29 U/L 11    Discharge instruction: per After Visit Summary and "Baby and Me Booklet".  After  visit meds:  Allergies as of 08/05/2016   No Known Allergies     Medication List    TAKE these medications   ibuprofen 800 MG tablet Commonly known as:  ADVIL,MOTRIN Take 1 tablet (800 mg total) by mouth every 8 (eight) hours as needed.   PRENATAL GUMMIES/DHA & FA 0.4-32.5 MG Chew Chew 2 each by mouth every evening.   ranitidine 150 MG tablet Commonly known as:  ZANTAC Take 150 mg by mouth daily.       Diet: routine diet  Activity: Advance as tolerated. Pelvic rest for 6 weeks.   Outpatient follow up:6 weeks Follow up Appt:Future Appointments Date Time Provider [place]  08/08/2016 6:30 AM [facility] RO[facility] None  11/09/2016 8:30 AM Regina Eck, CNM [place] None  05/09/2017 9:00 AM Vicie Mutters, PA-C [facility] None   Follow up Visit:No Follow-up on file.  Postpartum contraception: Undecided  Newborn Data: Live born female  Birth Weight: 6 lb 11.8 oz (3055 g) APGAR: 8, 9  Baby Feeding: Breast Disposition:home with mother   08/05/2016 Janyth Contes, MD

## 2016-08-05 NOTE — Lactation Note (Signed)
This note was copied from a baby's chart. Lactation Consultation Note  Patient Name: Jade Webster [id number] Date: 08/05/2016 Reason for consult: Follow-up assessment Baby at 37 hr of life. Baby has not been feeding well today per RN. Upon entry baby was getting a diaper change and screaming. He has a noticeable lingual frenulum with a mid tongue insertion point. He has a high palate and did a lot of biting down/choping on the gloved finger. When finger feeding was down baby lost suction a lot while swallowing. Mom stated baby has been frantic at the breast since waking up after his circumcision. After the finger feeding baby was calmly rooting. Mom has bilateral sore nipples with a blister on the tip of the R nipple. She did cut herself with her bractlet yesterday and latching has made it worse today. Her L nipple appears slightly bruised with no skin break down. Mom was open to trying a #20 NS with a 5Fr at the breast at the next feeding. All the supplies are in the room and she will call for help with she is ready to latch baby. The lactation phone is on the white board.   Maternal Data    Feeding Feeding Type: Breast Fed Length of feed: 0 min  LATCH Score/Interventions Latch: Too sleepy or reluctant, no latch achieved, no sucking elicited.  Audible Swallowing: A few with stimulation  Type of Nipple: Everted at rest and after stimulation  Comfort (Breast/Nipple): Filling, red/small blisters or bruises, mild/mod discomfort     Hold (Positioning): No assistance needed to correctly position infant at breast.  LATCH Score: 8  Lactation Tools Discussed/Used     Consult Status Consult Status: Follow-up Date: 08/06/16 Follow-up type: In-patient    Denzil Hughes 08/05/2016, 7:36 PM

## 2016-08-06 ENCOUNTER — Ambulatory Visit: Payer: Self-pay

## 2016-08-06 NOTE — Lactation Note (Addendum)
This note was copied from a baby's chart. Lactation Consultation Note Baby has weight loss and difficulty latching, mom has scabbed nipple to Rt. Nipple and abrasion to Rt. Upper areola from moms arm band. Has small breast, filling heavy. Knots noted, tender. Encouraged to massage. Mom has #16 NS, fits well. Latched baby to [place]. Breast. Baby BF well. Massage breast while BF, good suck swallow coordination noted as well as good compression on breast. Unlatched to check for colostrum transfer in NS. None noted. But breast are softer and less tender. Rt. Breast applied ice and massaged. Knots noted. 0.5mg  colostrum hand expressed. Laid mom supine with ICE on breast. When BF mom is using 5 fr. Feeding in NS to supplement. A lot of teaching with mom and dad.  Cluster feeding education as well as filling and engorgement.\ Hand pump given,shells, and coconut oil to apply in NS to lubercate nipples. Mom is to wash off before appling comfort gels. #21 flange for hand pump d/t small nipples.  Patient Name: Jade Webster [id number] Date: 08/06/2016 Reason for consult: Follow-up assessment;Breast/nipple pain;Infant weight loss   Maternal Data    Feeding Feeding Type: Breast Fed Length of feed: 40 min  LATCH Score/Interventions Latch: Repeated attempts needed to sustain latch, nipple held in mouth throughout feeding, stimulation needed to elicit sucking reflex. Intervention(s): Teach feeding cues;Waking techniques Intervention(s): Adjust position;Assist with latch;Breast massage;Breast compression  Audible Swallowing: None Intervention(s): Skin to skin;Hand expression  Type of Nipple: Everted at rest and after stimulation (no shaft)  Comfort (Breast/Nipple): Engorged, cracked, bleeding, large blisters, severe discomfort Problem noted: Cracked, bleeding, blisters, bruises Intervention(s): Hand pump;Expressed breast milk to nipple  Problem noted: Filling;Cracked, bleeding, blisters,  bruises;Mild/Moderate discomfort Interventions (Filling): Massage;Firm support;Frequent nursing;Hand pump Interventions (Mild/moderate discomfort): Hand massage;Hand expression;Pre-pump if needed;Post-pump;Comfort gels;Breast shields Interventions (Severe discomfort): Flange size (#21)  Hold (Positioning): Assistance needed to correctly position infant at breast and maintain latch. Intervention(s): Breastfeeding basics reviewed;Support Pillows;Position options;Skin to skin  LATCH Score: 4  Lactation Tools Discussed/Used Tools: Shells;Nipple Shields;Pump;Comfort gels;Flanges Nipple shield size: 16 Flange Size: Other (comment) (21) Shell Type: Inverted Breast pump type: Manual Pump Review: Setup, frequency, and cleaning Initiated by:: Allayne Stack RN IBCLC Date initiated:: 08/06/16   Consult Status Consult Status: Follow-up Date: 08/06/16 Follow-up type: In-patient    Tvisha Schwoerer, Elta [place] 08/06/2016, 3:12 AM

## 2016-08-06 NOTE — Lactation Note (Signed)
This note was copied from a baby's chart. Lactation Consultation Note  Patient Name: Jade Webster [id number] Date: 08/06/2016  Follow up visit made prior to discharge.  Mom is comfortable with her feeding plan.  She is putting the baby to the breast with cues using nipple shield and giving small amounts of supplement at breast with a 5 french feeding tube/syringe.  Breasts are full this AM.  Mom has a DEBP to post pump after feeds.  Reviewed engorgement treatment.  Mom will continue feeding plan and come back for outpatient appointment Thursday 08/09/16 1030.   Maternal Data    Feeding Feeding Type: Breast Fed Length of feed: 15 min  LATCH Score/Interventions                      Lactation Tools Discussed/Used Tools: Supplemental Nutrition System;Nipple [facility] Status      Ave Filter 08/06/2016, 10:14 AM

## 2016-08-08 ENCOUNTER — Inpatient Hospital Stay (HOSPITAL_COMMUNITY): Admission: RE | Admit: 2016-08-08 | Payer: 59 | Source: Ambulatory Visit

## 2016-08-09 ENCOUNTER — Ambulatory Visit (HOSPITAL_COMMUNITY): Admission: RE | Admit: 2016-08-09 | Payer: 59 | Source: Ambulatory Visit

## 2016-11-09 ENCOUNTER — Ambulatory Visit: Payer: 59 | Admitting: Certified Nurse Midwife

## 2017-05-07 NOTE — Progress Notes (Signed)
Complete Physical  Assessment and Plan: Depression Depression-off meds at this time stress management techniques discussed, increase water, good sleep hygiene discussed, increase exercise, and increase veggies.  - TSH  Anxiety Off meds at [facility] time, stress management techniques discussed, increase water, good sleep hygiene discussed, increase exercise, and increase veggies.  - TSH  Anemia, unspecified anemia type - Iron and TIBC - Vitamin B12  Routine general medical examination at a health care facility 1 year  Screening cholesterol level - Lipid panel  Gastroesophageal reflux disease, esophagitis presence not specified Continue PPI/H2 blocker, diet discussed  Medication management - CBC with Differential/Platelet - BASIC METABOLIC PANEL WITH GFR - Hepatic function panel  Pes Planus Get better support, stretches done  Discussed med's effects and SE's. Screening labs and tests as requested with regular follow-up as recommended.  HPI 26 y.o. female  presents for a complete physical.  Her blood pressure has been controlled at home, today their BP is BP: 116/64 She does workout. She denies chest pain, shortness of breath, dizziness.  Patient is on Vitamin D supplement.   Lab Results  Component Value Date   VD25OH 53 04/28/2014     She has been on Lexapro, wellbutrin, zoloft, and celexa in the past which she did not tolerate. She is not on xanax at this time.  Lab Results  Component Value Date   CHOL 193 05/04/2016   HDL 82 05/04/2016   LDLCALC 93 05/04/2016   TRIG 90 05/04/2016   CHOLHDL 2.4 05/04/2016   She is tired but has 2 kids, works [employer], husband works in [employer]. Husband had vasectomy.  She has history of GERD/allergies, voice is hoarse but no other changes Last A1C Lab Results  Component Value Date   HGBA1C 4.6 05/04/2016   BMI is Body mass index is 27.42 kg/m., she is working on diet and exercise. Wt Readings from Last 3 Encounters:   05/09/17 154 lb 12.8 oz (70.2 kg)  08/04/16 175 lb (79.4 kg)  07/21/16 174 lb 6.4 oz (79.1 kg)    Current Medications:  Current Outpatient Prescriptions on File Prior to Visit  Medication Sig Dispense Refill  . ibuprofen (ADVIL,MOTRIN) 800 MG tablet Take 1 tablet (800 mg total) by mouth every 8 (eight) hours as needed. 45 tablet 1  . Prenatal MV-Min-FA-Omega-3 (PRENATAL GUMMIES/DHA & FA) 0.4-32.5 MG CHEW Chew 2 each by mouth every evening. 180 tablet 5  . ranitidine (ZANTAC) 150 MG tablet Take 150 mg by mouth daily.     No current facility-administered medications on file prior to visit.    Health Maintenance:   Immunization History  Administered Date(s) Administered  . Influenza Inj Mdck Quad With Preservative 05/09/2017  . Influenza Split 08/30/2012  . MMR 08/31/2012  . Rho (D) Immune Globulin 08/30/2012  . Tdap 08/30/2012   Tetanus: 2016 Pneumovax: Prevnar 13:  Flu vaccine: 2014 TODAY Zostavax: Pap: 09/2016 OB/GYN with negative STD testing.  MGM: N/A DEXA: Colonoscopy: EGD:  Medical History:  Past Medical History:  Diagnosis Date  . Anemia   . Anxiety   . Depression    Allergies No Known Allergies  SURGICAL HISTORY She  has a past surgical history that includes Eye surgery; Wisdom tooth extraction; and nexplanon. FAMILY HISTORY Her family history includes COPD in her maternal grandmother; Cancer in her father and maternal grandmother; Diabetes in her father; Heart attack in her father; Stroke in her maternal grandfather. SOCIAL HISTORY She  reports that she has never smoked. She has never  used smokeless tobacco. She reports that she drinks about 1.2 - 1.8 oz of alcohol per week . She reports that she does not use drugs.   Review of Systems  Constitutional: Negative.   HENT: Negative.   Eyes: Negative.   Respiratory: Negative.   Cardiovascular: Negative.   Gastrointestinal: Negative for heartburn.  Genitourinary: Negative.   Musculoskeletal: Positive  for joint pain (bilateral feet, wrose left 2nd toe x 6 months). Negative for back pain, falls, myalgias and neck pain.  Skin: Negative.   Neurological: Negative.   Endo/Heme/Allergies: Negative.   Psychiatric/Behavioral: Negative for depression, hallucinations, memory loss, substance abuse and suicidal ideas. The patient is not nervous/anxious and does not have insomnia.     Physical Exam: Estimated body mass index is 27.42 kg/m as calculated from the following:   Height as of this encounter: _0  (1.6 m).   Weight as of this encounter: 154 lb 12.8 oz (70.2 kg). BP 116/64   Pulse 91   Temp (!) 97.2 F (36.2 C)   Resp 16   Ht _1  (1.6 m)   Wt 154 lb 12.8 oz (70.2 kg)   LMP 04/13/2017   SpO2 97%   BMI 27.42 kg/m  General Appearance: Well nourished, in no apparent distress. Eyes: PERRLA, EOMs, conjunctiva no swelling or erythema, normal fundi and vessels. Sinuses: No Frontal/maxillary tenderness ENT/Mouth: Ext aud canals clear, normal light reflex with TMs without erythema, bulging.  Good dentition. No erythema, swelling, or exudate on post pharynx. Tonsils not swollen or erythematous. Hearing normal.  Neck: Supple, thyroid normal. No bruits Respiratory: Respiratory effort normal, BS equal bilaterally without rales, rhonchi, wheezing or stridor. Cardio: RRR without murmurs, rubs or gallops. Brisk peripheral pulses without edema.  Chest: symmetric, with normal excursions and percussion. Breasts: defer Abdomen: Soft, +BS. nontender no guarding, rebound, hernias, masses, or organomegaly. .  Lymphatics: Non tender without lymphadenopathy.  Genitourinary: defer Musculoskeletal: Full ROM all peripheral extremities,5/5 strength, and normal gait. + pes planus.  Skin:   Warm, dry without rashes, lesions, ecchymosis.  Neuro: Cranial nerves intact, reflexes equal bilaterally. Normal muscle tone, no cerebellar symptoms. Sensation intact.  Psych: Awake and oriented X 3, normal affect,  Insight and Judgment appropriate.    Vicie Mutters 9:20 AM

## 2017-05-09 ENCOUNTER — Encounter: Payer: Self-pay | Admitting: Physician Assistant

## 2017-05-09 ENCOUNTER — Ambulatory Visit (INDEPENDENT_AMBULATORY_CARE_PROVIDER_SITE_OTHER): Payer: 59 | Admitting: Physician Assistant

## 2017-05-09 VITALS — BP 116/64 | HR 91 | Temp 97.2°F | Resp 16 | Ht 63.0 in | Wt 154.8 lb

## 2017-05-09 DIAGNOSIS — Z23 Encounter for immunization: Secondary | ICD-10-CM | POA: Diagnosis not present

## 2017-05-09 DIAGNOSIS — Z79899 Other long term (current) drug therapy: Secondary | ICD-10-CM

## 2017-05-09 DIAGNOSIS — K219 Gastro-esophageal reflux disease without esophagitis: Secondary | ICD-10-CM

## 2017-05-09 DIAGNOSIS — F329 Major depressive disorder, single episode, unspecified: Secondary | ICD-10-CM

## 2017-05-09 DIAGNOSIS — Z0000 Encounter for general adult medical examination without abnormal findings: Secondary | ICD-10-CM

## 2017-05-09 DIAGNOSIS — D649 Anemia, unspecified: Secondary | ICD-10-CM

## 2017-05-09 DIAGNOSIS — F419 Anxiety disorder, unspecified: Secondary | ICD-10-CM

## 2017-05-09 DIAGNOSIS — Z13220 Encounter for screening for lipoid disorders: Secondary | ICD-10-CM

## 2017-05-09 DIAGNOSIS — Z1389 Encounter for screening for other disorder: Secondary | ICD-10-CM

## 2017-05-09 DIAGNOSIS — E559 Vitamin D deficiency, unspecified: Secondary | ICD-10-CM

## 2017-05-09 DIAGNOSIS — F32A Depression, unspecified: Secondary | ICD-10-CM

## 2017-05-09 NOTE — Patient Instructions (Addendum)
Check out vionic for your flat feet the shoes or the inserts Get compression stockings, I get mine from [facility]  Common causes of cough OR hoarseness OR sore throat:   Allergies, Viral Infections, Acid Reflux and Bacterial Infections.  1) Allergies and viral infections cause a cough OR sore throat by post nasal drip and are often worse at night, can also have sneezing, lower grade fevers, clear/yellow mucus. This is best treated with allergy medications or nasal sprays.  Please get on allegra for 1-2 weeks The strongest is allegra or fexafinadine  Cheapest at [employer], [employer], [employer]  2) Bacterial infections are more severe than allergies or viral infections with fever, teeth pain, fatigue. This can be treated with prednisone and the same over the counter medication and after 7 days can be treated with an antibiotic.   3) Silent reflux/GERD can cause a cough OR sore throat OR hoarseness WITHOUT heart burn because the esophagus that goes to the stomach and trachea that goes to the lungs are very close and when you lay down the acid can irritate your throat and lungs. This can cause hoarseness, cough, and wheezing. Please stop any alcohol or anti-inflammatories like aleve/advil/ibuprofen and start an over the counter Prilosec or omeprazole 1-2 times daily 96mins before food for 2 weeks, then switch to over the counter zantac/ratinidine or pepcid/famotadine once at night for 2 weeks.   4) sometimes irritation causes more irritation. Try voice rest, use sugar free cough drops to prevent coughing, and try to stop clearing your throat.   If you ever have a cough that does not go away after trying these things please make a follow up visit for further evaluation or we can refer you to a specialist. Or if you ever have shortness of breath or chest pain go to the ER.      [facility] Tendinitis [facility] Ask your health care provider which exercises are safe for you. Do exercises exactly as told by your health  care provider and adjust them as directed. It is normal to feel mild stretching, pulling, tightness, or discomfort as you do these exercises, but you should stop right away if you feel sudden pain or your pain gets worse. Do not begin these exercises until told by your health care provider. Stretching and range of motion exercises These exercises warm up your muscles and joints and improve the movement and flexibility of your ankle. These exercises also help to relieve pain, numbness, and tingling. Exercise A: Standing wall calf stretch, knee straight  1. Stand with your hands against a wall. 2. Extend your __________ leg behind you and bend your front knee slightly. Keep both of your heels on the floor. 3. Point the toes of your back foot slightly inward. 4. Keeping your heels on the floor and your back knee straight, shift your weight toward the wall. Do not allow your back to arch. You should feel a gentle stretch in your calf. 5. Hold this position for seconds. Repeat __________ times. Complete this stretch __________ times per day. Exercise B: Standing wall calf stretch, knee bent 1. Stand with your hands against a wall. 2. Extend your __________ leg behind you, and bend your front knee slightly. Keep both of your heels on the floor. 3. Point the toes of your back foot slightly inward. 4. Keeping your heels on the floor, unlock your back knee so that it is bent. You should feel a gentle stretch deep in your calf. 5. Hold this position for  __________ seconds. Repeat __________ times. Complete this stretch __________ times per day. Strengthening exercises These exercises build strength and control of your ankle. Endurance is the ability to use your muscles for a long time, even after they get tired. Exercise C: Plantar flexion with band  1. Sit on the floor with your __________ leg extended. You may put a pillow under your calf to give your foot more room to move. 2. Loop a rubber exercise  band or tube around the ball of your __________ foot. The ball of your foot is on the walking surface, right under your toes. The band or tube should be slightly tense when your foot is relaxed. If the band or tube slips, you can put on your shoe or put a washcloth between the band and your foot to help it stay in place. 3. Slowly point your toes downward, pushing them away from you. 4. Hold this position for __________ seconds. 5. Slowly release the tension in the band or tube, controlling smoothly until your foot is back to the starting position. Repeat __________ times. Complete this exercise __________ times per day. Exercise D: Heel raise with eccentric lower  1. Stand on a step with the balls of your feet. The ball of your foot is on the walking surface, right under your toes. ? Do not put your heels on the step. ? For balance, rest your hands on the wall or on a railing. 2. Rise up onto the balls of your feet. 3. Keeping your heels up, shift all of your weight to your __________ leg and pick up your other leg. 4. Slowly lower your __________ leg so your heel drops below the level of the step. 5. Put down your foot. If told by your health care provider, build up to:  3 sets of 15 repetitions while keeping your knees straight.  3 sets of 15 repetitions while keeping your knees bent as far as told by your health care provider.  Complete this exercise __________ times per day. If this exercise is too easy, try doing it while wearing a backpack with weights in it. Balance exercises These exercises improve or maintain your balance. Balance is important in preventing falls. Exercise E: Single leg stand 1. Without shoes, stand near a railing or in a door frame. Hold on to the railing or door frame as needed. 2. Stand on your __________ foot. Keep your big toe down on the floor and try to keep your arch lifted. 3. Hold this position for __________ seconds. Repeat __________ times. Complete  this exercise __________ times per day. If this exercise is too easy, you can try it with your eyes closed or while standing on a pillow. This information is not intended to replace advice given to you by your health care provider. Make sure you discuss any questions you have with your health care provider. Document Released: 03/07/2005 Document Revised: 04/12/2016 Document Reviewed: 04/12/2015 Elsevier Interactive Patient Education  [employer].

## 2017-05-10 LAB — URINALYSIS W MICROSCOPIC + REFLEX CULTURE
BACTERIA UA: NONE SEEN /HPF
Bilirubin Urine: NEGATIVE
GLUCOSE, UA: NEGATIVE
HGB URINE DIPSTICK: NEGATIVE
HYALINE CAST: NONE SEEN /LPF
Ketones, ur: NEGATIVE
Leukocyte Esterase: NEGATIVE
Nitrites, Initial: NEGATIVE
PH: 5.5 (ref 5.0–8.0)
Protein, ur: NEGATIVE
Specific Gravity, Urine: 1.024 (ref 1.001–1.03)

## 2017-05-10 LAB — CBC WITH DIFFERENTIAL/PLATELET
BASOS ABS: 40 {cells}/uL (ref 0–200)
Basophils Relative: 0.8 %
EOS ABS: 60 {cells}/uL (ref 15–500)
Eosinophils Relative: 1.2 %
HEMATOCRIT: 39.1 % (ref 35.0–45.0)
HEMOGLOBIN: 12.5 g/dL (ref 11.7–15.5)
Lymphs Abs: 1655 cells/uL (ref 850–3900)
MCH: 28.2 pg (ref 27.0–33.0)
MCHC: 32 g/dL (ref 32.0–36.0)
MCV: 88.1 fL (ref 80.0–100.0)
MONOS PCT: 8.5 %
MPV: 11.6 fL (ref 7.5–12.5)
NEUTROS ABS: 2820 {cells}/uL (ref 1500–7800)
Neutrophils Relative %: 56.4 %
Platelets: 205 10*3/uL (ref 140–400)
RBC: 4.44 10*6/uL (ref 3.80–5.10)
RDW: 12.1 % (ref 11.0–15.0)
Total Lymphocyte: 33.1 %
WBC mixed population: 425 cells/uL (ref 200–950)
WBC: 5 10*3/uL (ref 3.8–10.8)

## 2017-05-10 LAB — TSH: TSH: 0.71 mIU/L

## 2017-05-10 LAB — MAGNESIUM: MAGNESIUM: 2 mg/dL (ref 1.5–2.5)

## 2017-05-10 LAB — LIPID PANEL
CHOL/HDL RATIO: 2.8 (calc) (ref <5.0)
Cholesterol: 160 mg/dL (ref <200)
HDL: 58 mg/dL (ref >50)
LDL CHOLESTEROL (CALC): 88 mg/dL
NON-HDL CHOLESTEROL (CALC): 102 mg/dL (ref <130)
TRIGLYCERIDES: 54 mg/dL (ref <150)

## 2017-05-10 LAB — VITAMIN B12: Vitamin B-12: 711 pg/mL (ref 200–1100)

## 2017-05-10 LAB — IRON, TOTAL/TOTAL IRON BINDING CAP
%SAT: 33 % (calc) (ref 11–50)
Iron: 106 ug/dL (ref 40–190)
TIBC: 319 mcg/dL (calc) (ref 250–450)

## 2017-05-10 LAB — BASIC METABOLIC PANEL WITH GFR
BUN: 16 mg/dL (ref 7–25)
CALCIUM: 9 mg/dL (ref 8.6–10.2)
CO2: 24 mmol/L (ref 20–32)
Chloride: 108 mmol/L (ref 98–110)
Creat: 0.57 mg/dL (ref 0.50–1.10)
GFR, EST AFRICAN AMERICAN: 148 mL/min/{1.73_m2} (ref >=60)
GFR, EST NON AFRICAN AMERICAN: 128 mL/min/{1.73_m2} (ref >=60)
GLUCOSE: 94 mg/dL (ref 65–99)
Potassium: 4.2 mmol/L (ref 3.5–5.3)
Sodium: 137 mmol/L (ref 135–146)

## 2017-05-10 LAB — HEPATIC FUNCTION PANEL
AG RATIO: 1.6 (calc) (ref 1.0–2.5)
ALBUMIN MSPROF: 4.2 g/dL (ref 3.6–5.1)
ALT: 10 U/L (ref 6–29)
AST: 12 U/L (ref 10–30)
Alkaline phosphatase (APISO): 42 U/L (ref 33–115)
BILIRUBIN DIRECT: 0.1 mg/dL (ref 0.0–0.2)
GLOBULIN: 2.6 g/dL (ref 1.9–3.7)
Indirect Bilirubin: 0.6 mg/dL (calc) (ref 0.2–1.2)
Total Bilirubin: 0.7 mg/dL (ref 0.2–1.2)
Total Protein: 6.8 g/dL (ref 6.1–8.1)

## 2017-05-10 LAB — MICROALBUMIN / CREATININE URINE RATIO
Creatinine, Urine: 109 mg/dL (ref 20–275)
MICROALB UR: 0.2 mg/dL
MICROALB/CREAT RATIO: 2 ug/mg{creat} (ref <30)

## 2017-05-10 LAB — NO CULTURE INDICATED

## 2017-05-10 LAB — VITAMIN D 25 HYDROXY (VIT D DEFICIENCY, FRACTURES): VIT D 25 HYDROXY: 28 ng/mL — AB (ref 30–100)

## 2017-05-10 NOTE — Progress Notes (Signed)
Pt aware of lab results & voiced understanding of those results.

## 2017-09-18 ENCOUNTER — Ambulatory Visit: Payer: BLUE CROSS/BLUE SHIELD | Admitting: Internal Medicine

## 2017-09-18 ENCOUNTER — Encounter: Payer: Self-pay | Admitting: Internal Medicine

## 2017-09-18 VITALS — BP 106/74 | HR 76 | Temp 97.5°F | Resp 16 | Ht 63.0 in | Wt 158.0 lb

## 2017-09-18 DIAGNOSIS — R110 Nausea: Secondary | ICD-10-CM

## 2017-09-18 DIAGNOSIS — H6501 Acute serous otitis media, right ear: Secondary | ICD-10-CM

## 2017-09-18 DIAGNOSIS — L02416 Cutaneous abscess of left lower limb: Secondary | ICD-10-CM | POA: Diagnosis not present

## 2017-09-18 DIAGNOSIS — J0140 Acute pansinusitis, unspecified: Secondary | ICD-10-CM

## 2017-09-18 MED ORDER — ONDANSETRON HCL 8 MG PO TABS: ORAL_TABLET | ORAL | 1 refills | Status: DC

## 2017-09-18 MED ORDER — DOXYCYCLINE HYCLATE 100 MG PO CAPS: ORAL_CAPSULE | ORAL | 1 refills | Status: DC

## 2017-09-18 MED ORDER — PREDNISONE 20 MG PO TABS: ORAL_TABLET | ORAL | 0 refills | Status: DC

## 2017-09-18 NOTE — Progress Notes (Signed)
  Subjective:    Patient ID: Jade Webster, female    DOB: [birth year], 27 y.o.   MRN: [id number]  HPI  This nice 27 yo WF mother of a 88 & 15 yo presents with a 2 week hx/o frontal/maxillary HA with sinus congestion and dry cough attributed to post nasal drainage. Denies fever, chill, sweats, CP, rash or dyspnea.  She also has a "bump" on her medial Prox Lt Thigh  Medication Sig  . Ibuprofen 800 MG tablet Take 1 tablet  every 8 (eight) hours as needed.  . ranitidine  150 MG tablet Take 150 mg by mouth daily.  Marland Kitchen PRENATAL GUMMIES/DHA & FA Chew 2 each by mouth every evening.   No Known Allergies  Review of Systems  10 point systems review negative except as above.    Objective:   Physical Exam  BP 106/74   Pulse 76   Temp (!) 97.5 F (36.4 C)   Resp 16   Ht 5\' 3"  (1.6 m)   Wt 158 lb (71.7 kg)   BMI 27.99 kg/m   No Distress. (+) dry cough.   HEENT -  Bilat EAC's clear / Rt TM is pink, sl retracted & Lt is Nl. (+) Frontomaxillary tenderness. N/O clear. Neck - supple.  Chest - Clear equal BS. Cor - Nl HS. RRR w/o sig MGR. PP 1(+). No edema. MS- FROM w/o deformities.  Gait Nl. Skin - Small 1 cm tender boil of the mid Lt medial Thigh (Chaporoned by Nurse C. Evans)   Neuro - Nl w/o focal abnormalities    Assessment & Plan:   1. Subacute pansinusitis  - doxycycline (VIBRAMYCIN) 100 MG capsule; Take 1 capsule 2 x / day with food for 10 days, then 1 x /day with food for infection  Dispense: 30 capsule; Refill: 1  - predniSONE (DELTASONE) 20 MG tablet; 1 tab 3 x day for 3 days, then 1 tab 2 x day for 3 days, then 1 tab 1 x day for 5 days  Dispense: 20 tablet; Refill: 0  2. Right acute serous otitis media  - doxycycline (VIBRAMYCIN) 100 MG capsule; Take 1 capsule 2 x / day with food for 10 days, then 1 x /day with food for infection  Dispense: 30 capsule; Refill: 1  - predniSONE (DELTASONE) 20 MG tablet; 1 tab 3 x day for 3 days, then 1 tab 2 x day for 3 days, then 1 tab 1 x day for  5 days  Dispense: 20 tablet; Refill: 0  3. Abscess of left thigh  - doxycycline (VIBRAMYCIN) 100 MG capsule; Take 1 capsule 2 x / day with food for 10 days, then 1 x /day with food for infection  Dispense: 30 capsule; Refill: 1  4. Nausea  - ondansetron (ZOFRAN) 8 MG tablet; Take 1/2 to 1 tablet 3 x/ day as needed for Nausea  Dispense: 30 tablet; Refill: 1  - ROV if sx's not resolve.

## 2017-09-18 NOTE — Patient Instructions (Signed)

## 2018-05-15 ENCOUNTER — Encounter: Payer: Self-pay | Admitting: Physician Assistant

## 2018-06-11 LAB — HM PAP SMEAR

## 2018-09-08 ENCOUNTER — Encounter: Payer: Self-pay | Admitting: Physician Assistant

## 2018-09-08 DIAGNOSIS — R590 Localized enlarged lymph nodes: Secondary | ICD-10-CM | POA: Insufficient documentation

## 2018-11-26 ENCOUNTER — Other Ambulatory Visit: Payer: Self-pay

## 2018-11-27 ENCOUNTER — Encounter: Payer: Self-pay | Admitting: Obstetrics and Gynecology

## 2018-11-27 ENCOUNTER — Ambulatory Visit (INDEPENDENT_AMBULATORY_CARE_PROVIDER_SITE_OTHER): Payer: Managed Care, Other (non HMO) | Admitting: Obstetrics and Gynecology

## 2018-11-27 VITALS — BP 128/90 | HR 68 | Temp 98.8°F | Resp 18 | Ht 62.0 in | Wt 170.0 lb

## 2018-11-27 DIAGNOSIS — Z3009 Encounter for other general counseling and advice on contraception: Secondary | ICD-10-CM

## 2018-11-27 NOTE — Progress Notes (Signed)
28 y.o. [id number] Single White or Caucasian Not Hispanic or Latino female here for contraception consult.  Not sexually active for the last 3 month, was with the same partner x 8 years (broke up 3 months ago).  She had a nexplanon in the past and did well with it. No significant side effects.      Patient's last menstrual period was 11/12/2018 (approximate).          Sexually active: No.  The current method of family planning is abstinence.    Exercising: Yes.    walking Smoker:  no  Health Maintenance: Pap:  06/11/18 negative History of abnormal Pap:  no TDaP:  2017 Gardasil: completed per pt.    reports that she has never smoked. She has never used smokeless tobacco. She reports current alcohol use of about 3.0 standard drinks of alcohol per week. She reports that she does not use drugs. 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   Past Medical History:  Diagnosis Date  . Anemia   . Anxiety   . Depression     Past Surgical History:  Procedure Laterality Date  . EYE SURGERY     for lazy eye  . nexplanon     insertion 2/14, removed 2017  . WISDOM TOOTH EXTRACTION      Current Outpatient Medications  Medication Sig Dispense Refill  . fluticasone (FLONASE) 50 MCG/ACT nasal spray daily as needed.    . Vitamin D, Ergocalciferol, (DRISDOL) 1.25 MG (50000 UT) CAPS capsule once a week.     No current facility-administered medications for this visit.     Family History  Problem Relation Age of Onset  . Diabetes Father   . Heart attack Father   . Cancer Father        leaukemia  . COPD Maternal Grandmother   . Cancer Maternal Grandmother        bone  . Stroke Maternal Grandfather   . Other Neg Hx     Review of Systems  Constitutional: Negative.   HENT: Negative.   Eyes: Negative.   Respiratory: Negative.   Cardiovascular: Negative.   Gastrointestinal: Negative.   Endocrine: Negative.   Genitourinary: Negative.   Musculoskeletal: Negative.   Skin: Negative.    Allergic/Immunologic: Negative.   Neurological: Negative.   Hematological: Negative.   Psychiatric/Behavioral: Negative.   All other systems reviewed and are negative.   Exam:   BP 128/90   Pulse 68   Temp 98.8 F (37.1 C) (Oral)   Resp 18   Ht 5\' 2"  (1.575 m)   Wt 170 lb (77.1 kg)   LMP 11/12/2018 (Approximate)   BMI 31.09 kg/m   Weight change: @WEIGHTCHANGE @ Height:   Height: 5\' 2"  (157.5 cm)  Ht Readings from Last 3 Encounters:  11/27/18 5\' 2"  (1.575 m)  09/18/17 5\' 3"  (1.6 m)  05/09/17 5\' 3"  (1.6 m)    General appearance: alert, cooperative and appears stated age  A:  Contraception counseling   P:   Desires the nexplanon  Return on her cycle for insertion  Due for annual in 10/20

## 2018-11-27 NOTE — Patient Instructions (Signed)
Etonogestrel implant  What is this medicine?  ETONOGESTREL (et oh noe JES trel) is a contraceptive (birth control) device. It is used to prevent pregnancy. It can be used for up to 3 years.  This medicine may be used for other purposes; ask your health care provider or pharmacist if you have questions.  COMMON BRAND NAME(S): Implanon, Nexplanon  What should I tell my health care provider before I take this medicine?  They need to know if you have any of these conditions:  -abnormal vaginal bleeding  -blood vessel disease or blood clots  -breast, cervical, endometrial, ovarian, liver, or uterine cancer  -diabetes  -gallbladder disease  -heart disease or recent heart attack  -high blood pressure  -high cholesterol or triglycerides  -kidney disease  -liver disease  -migraine headaches  -seizures  -stroke  -tobacco smoker  -an unusual or allergic reaction to etonogestrel, anesthetics or antiseptics, other medicines, foods, dyes, or preservatives  -pregnant or trying to get pregnant  -breast-feeding  How should I use this medicine?  This device is inserted just under the skin on the inner side of your upper arm by a health care professional.  Talk to your pediatrician regarding the use of this medicine in children. Special care may be needed.  Overdosage: If you think you have taken too much of this medicine contact a poison control center or emergency room at once.  NOTE: This medicine is only for you. Do not share this medicine with others.  What if I miss a dose?  This does not apply.  What may interact with this medicine?  Do not take this medicine with any of the following medications:  -amprenavir  -fosamprenavir  This medicine may also interact with the following medications:  -acitretin  -aprepitant  -armodafinil  -bexarotene  -bosentan  -carbamazepine  -certain medicines for fungal infections like fluconazole, ketoconazole, itraconazole and voriconazole  -certain medicines to treat hepatitis, HIV or  AIDS  -cyclosporine  -felbamate  -griseofulvin  -lamotrigine  -modafinil  -oxcarbazepine  -phenobarbital  -phenytoin  -primidone  -rifabutin  -rifampin  -rifapentine  -St. John's wort  -topiramate  This list may not describe all possible interactions. Give your health care provider a list of all the medicines, herbs, non-prescription drugs, or dietary supplements you use. Also tell them if you smoke, drink alcohol, or use illegal drugs. Some items may interact with your medicine.  What should I watch for while using this medicine?  This product does not protect you against HIV infection (AIDS) or other sexually transmitted diseases.  You should be able to feel the implant by pressing your fingertips over the skin where it was inserted. Contact your doctor if you cannot feel the implant, and use a non-hormonal birth control method (such as condoms) until your doctor confirms that the implant is in place. Contact your doctor if you think that the implant may have broken or become bent while in your arm.  You will receive a user card from your health care provider after the implant is inserted. The card is a record of the location of the implant in your upper arm and when it should be removed. Keep this card with your health records.  What side effects may I notice from receiving this medicine?  Side effects that you should report to your doctor or health care professional as soon as possible:  -allergic reactions like skin rash, itching or hives, swelling of the face, lips, or tongue  -breast lumps, breast tissue   changes, or discharge  -breathing problems  -changes in emotions or moods  -if you feel that the implant may have broken or bent while in your arm  -high blood pressure  -pain, irritation, swelling, or bruising at the insertion site  -scar at site of insertion  -signs of infection at the insertion site such as fever, and skin redness, pain or discharge  -signs and symptoms of a blood clot such as breathing  problems; changes in vision; chest pain; severe, sudden headache; pain, swelling, warmth in the leg; trouble speaking; sudden numbness or weakness of the face, arm or leg  -signs and symptoms of liver injury like dark yellow or brown urine; general ill feeling or flu-like symptoms; light-colored stools; loss of appetite; nausea; right upper belly pain; unusually weak or tired; yellowing of the eyes or skin  -unusual vaginal bleeding, discharge  Side effects that usually do not require medical attention (report to your doctor or health care professional if they continue or are bothersome):  -acne  -breast pain or tenderness  -headache  -irregular menstrual bleeding  -nausea  This list may not describe all possible side effects. Call your doctor for medical advice about side effects. You may report side effects to FDA at 1-800-FDA-1088.  Where should I keep my medicine?  This drug is given in a hospital or clinic and will not be stored at home.  NOTE: This sheet is a summary. It may not cover all possible information. If you have questions about this medicine, talk to your doctor, pharmacist, or health care provider.   2019 Elsevier/Gold Standard (2017-06-25 14:11:42)

## 2018-12-08 ENCOUNTER — Telehealth: Payer: Self-pay | Admitting: Obstetrics and Gynecology

## 2018-12-08 DIAGNOSIS — Z3009 Encounter for other general counseling and advice on contraception: Secondary | ICD-10-CM

## 2018-12-08 DIAGNOSIS — Z30017 Encounter for initial prescription of implantable subdermal contraceptive: Secondary | ICD-10-CM

## 2018-12-08 NOTE — Telephone Encounter (Signed)
Patient is calling to schedule Nexplanon implant. Patient started cycle yesterday.

## 2018-12-08 NOTE — Telephone Encounter (Signed)
Spoke with patient, seen in office on 11/27/18, was to return call with menses to schedule nexplanon insertion. LMP 12/07/18. Nexplanon insertion scheduled for 4/22 at 10am with Dr. Talbert Nan. Advised to take Motrin 800 mg with food and water one hour before procedure. Order placed for precert, business office notified.   Routing to provider for final review. Patient is agreeable to disposition. Will close encounter.  Cc: Thayer Ohm, Lamont Snowball, RN

## 2018-12-08 NOTE — Telephone Encounter (Signed)
Left message to call Yzabella Crunk, RN at [facility] [phone].   

## 2018-12-10 ENCOUNTER — Encounter: Payer: Self-pay | Admitting: Obstetrics and Gynecology

## 2018-12-10 ENCOUNTER — Other Ambulatory Visit: Payer: Self-pay

## 2018-12-10 ENCOUNTER — Ambulatory Visit (INDEPENDENT_AMBULATORY_CARE_PROVIDER_SITE_OTHER): Payer: Managed Care, Other (non HMO) | Admitting: Obstetrics and Gynecology

## 2018-12-10 VITALS — BP 110/70 | HR 70 | Temp 98.2°F | Resp 16 | Wt 172.0 lb

## 2018-12-10 DIAGNOSIS — Z30017 Encounter for initial prescription of implantable subdermal contraceptive: Secondary | ICD-10-CM | POA: Diagnosis not present

## 2018-12-10 DIAGNOSIS — Z01812 Encounter for preprocedural laboratory examination: Secondary | ICD-10-CM | POA: Diagnosis not present

## 2018-12-10 LAB — POCT URINE PREGNANCY: Preg Test, Ur: NEGATIVE

## 2018-12-10 NOTE — Progress Notes (Signed)
GYNECOLOGY  VISIT   HPI: 28 y.o.   Single White or Caucasian Not Hispanic or Latino  female   [phone] with Patient's last menstrual period was 12/07/2018 (exact date).   here for  nexplanon insertion.   GYNECOLOGIC HISTORY: Patient's last menstrual period was 12/07/2018 (exact date). Contraception: abstinence (UPT neg today) Menopausal hormone therapy: none        OB History    Gravida  2   Para  2   Term  2   Preterm  0   AB  0   Living  2     SAB  0   TAB  0   Ectopic  0   Multiple  0   Live Births  2              Patient Active Problem List   Diagnosis Date Noted  . Cervical lymphadenopathy 09/08/2018  . GERD (gastroesophageal reflux disease) 05/05/2015  . Anxiety   . Depression   . Anemia     Past Medical History:  Diagnosis Date  . Anemia   . Anxiety   . Depression     Past Surgical History:  Procedure Laterality Date  . EYE SURGERY     for lazy eye  . nexplanon     insertion 2/14, removed 2017  . WISDOM TOOTH EXTRACTION      Current Outpatient Medications  Medication Sig Dispense Refill  . fluticasone (FLONASE) 50 MCG/ACT nasal spray daily as needed.    . Vitamin D, Ergocalciferol, (DRISDOL) 1.25 MG (50000 UT) CAPS capsule once a week.     No current facility-administered medications for this visit.      ALLERGIES: Patient has no known allergies.  Family History  Problem Relation Age of Onset  . Diabetes Father   . Heart attack Father   . Cancer Father        leaukemia  . COPD Maternal Grandmother   . Cancer Maternal Grandmother        bone  . Stroke Maternal Grandfather   . Other Neg Hx     Social History   Socioeconomic History  . Marital status: Single    Spouse name: Not on file  . Number of children: Not on file  . Years of education: Not on file  . Highest education level: Not on file  Occupational History  . Not on file  Social Needs  . Financial resource strain: Not on file  . Food insecurity:   Worry: Not on file    Inability: Not on file  . Transportation needs:    Medical: Not on file    Non-medical: Not on file  Tobacco Use  . Smoking status: Never Smoker  . Smokeless tobacco: Never Used  Substance and Sexual Activity  . Alcohol use: Yes    Alcohol/week: 3.0 standard drinks    Types: 3 Standard drinks or equivalent per week  . Drug use: No  . Sexual activity: Not Currently    Partners: Male    Birth control/protection: Abstinence  Lifestyle  . Physical activity:    Days per week: Not on file    Minutes per session: Not on file  . Stress: Not on file  Relationships  . Social connections:    Talks on phone: Not on file    Gets together: Not on file    Attends religious service: Not on file    Active member of club or organization: Not on file  Attends meetings of clubs or organizations: Not on file    Relationship status: Not on file  . Intimate partner violence:    Fear of current or ex partner: Not on file    Emotionally abused: Not on file    Physically abused: Not on file    Forced sexual activity: Not on file  Other Topics Concern  . Not on file  Social History Narrative  . Not on file    Review of Systems  Constitutional: Negative.   HENT: Negative.   Eyes: Negative.   Respiratory: Negative.   Cardiovascular: Negative.   Gastrointestinal: Negative.   Genitourinary: Negative.   Musculoskeletal: Negative.   Skin: Negative.   Neurological: Negative.   Endo/Heme/Allergies: Negative.   Psychiatric/Behavioral: Negative.     PHYSICAL EXAMINATION:    BP 110/70   Pulse 70   Temp 98.2 F (36.8 C) (Oral)   Resp 16   Wt 172 lb (78 kg)   LMP 12/07/2018 (Exact Date)   BMI 31.46 kg/m     General appearance: alert, cooperative and appears stated age  Risks of nexplanon removal and reinsertion were reviewed with the patient and a consent was signed.  The patient was placed in the supine position with her left arm bent at the elbow. The area was  marked with a pen, cleansed with betadine and injected with 1% lidocaine along the path where the nexplanon would be placed. The nexplanon device was inserted in the usual fashion without difficulty. Slight oozing from the insertion site was stopped with pressure. The device was palpated in place.  The patients arm was cleansed of betadine and a steri strip was placed over the incision. A gauze was wrapped around her arm.  She tolerated the procedure well  Instructions for care were discussed.     ASSESSMENT Contraception    PLAN Nexplanon placed Routine f/u   An After Visit Summary was printed and given to the patient.

## 2018-12-10 NOTE — Patient Instructions (Signed)
Nexplanon Instructions After Insertion  Keep bandage clean and dry for 24 hours  May use ice/Tylenol/Ibuprofen for soreness or pain  If you develop fever, drainage or increased warmth from incision site-contact office immediately   

## 2019-08-12 DIAGNOSIS — Z20828 Contact with and (suspected) exposure to other viral communicable diseases: Secondary | ICD-10-CM | POA: Diagnosis not present

## 2019-08-12 DIAGNOSIS — R438 Other disturbances of smell and taste: Secondary | ICD-10-CM | POA: Diagnosis not present

## 2019-09-03 ENCOUNTER — Telehealth: Payer: Self-pay | Admitting: Obstetrics and Gynecology

## 2019-09-03 NOTE — Telephone Encounter (Signed)
Patient has a hard knot at opening of vagina.

## 2019-09-03 NOTE — Telephone Encounter (Signed)
Left message to call Jaidyn Usery, RN at [facility] [phone].   

## 2019-09-29 NOTE — Telephone Encounter (Signed)
Left message for pt to call back to triage RN.  

## 2019-09-30 NOTE — Telephone Encounter (Signed)
Call to patient x2, no return call. MyChart not active.   Encounter closed.   Routing to Dr. Rosann Auerbach.

## 2019-10-09 ENCOUNTER — Telehealth: Payer: Self-pay | Admitting: Obstetrics and Gynecology

## 2019-10-09 NOTE — Telephone Encounter (Signed)
Patient has a hard mass on the inside of the vagina.

## 2019-10-09 NOTE — Telephone Encounter (Signed)
Spoke with patient. Patient reports a hard lump in vaginal canal, just inside opening. Has been present for 1 month. Denies any pain or abnormal d/c or odor. Patient is requesting an OV. OV scheduled for 2/24 at 8am with Dr. Talbert Nan.   Overdue for AEX, due 05/2019  Routing to provider for final review. Patient is agreeable to disposition. Will close encounter.

## 2019-10-12 NOTE — Telephone Encounter (Signed)
Call returned to patient. Left message, Advised upcoming appt on 2/24 has been changed to AEX, if any additional questions return call to office.

## 2019-10-12 NOTE — Telephone Encounter (Signed)
Can you please just change the visit to an annual exam (if the patient is okay with that plan)

## 2019-10-13 ENCOUNTER — Other Ambulatory Visit: Payer: Self-pay

## 2019-10-14 ENCOUNTER — Other Ambulatory Visit (HOSPITAL_COMMUNITY)
Admission: RE | Admit: 2019-10-14 | Discharge: 2019-10-14 | Disposition: A | Discharge status: Home / Self Care | Payer: BC Managed Care – PPO | Source: Ambulatory Visit | Attending: Obstetrics and Gynecology | Admitting: Obstetrics and Gynecology

## 2019-10-14 ENCOUNTER — Ambulatory Visit (INDEPENDENT_AMBULATORY_CARE_PROVIDER_SITE_OTHER): Payer: BC Managed Care – PPO | Admitting: Obstetrics and Gynecology

## 2019-10-14 ENCOUNTER — Encounter: Payer: Self-pay | Admitting: Obstetrics and Gynecology

## 2019-10-14 ENCOUNTER — Ambulatory Visit: Payer: Self-pay | Admitting: Obstetrics and Gynecology

## 2019-10-14 VITALS — BP 108/62 | HR 68 | Temp 98.4°F | Ht 62.5 in | Wt 169.0 lb

## 2019-10-14 DIAGNOSIS — R635 Abnormal weight gain: Secondary | ICD-10-CM

## 2019-10-14 DIAGNOSIS — R3915 Urgency of urination: Secondary | ICD-10-CM | POA: Diagnosis not present

## 2019-10-14 DIAGNOSIS — E559 Vitamin D deficiency, unspecified: Secondary | ICD-10-CM

## 2019-10-14 DIAGNOSIS — Z01419 Encounter for gynecological examination (general) (routine) without abnormal findings: Secondary | ICD-10-CM | POA: Diagnosis not present

## 2019-10-14 DIAGNOSIS — Z113 Encounter for screening for infections with a predominantly sexual mode of transmission: Secondary | ICD-10-CM | POA: Insufficient documentation

## 2019-10-14 DIAGNOSIS — Z6830 Body mass index (BMI) 30.0-30.9, adult: Secondary | ICD-10-CM

## 2019-10-14 DIAGNOSIS — R350 Frequency of micturition: Secondary | ICD-10-CM | POA: Diagnosis not present

## 2019-10-14 DIAGNOSIS — Z0000 Encounter for general adult medical examination without abnormal findings: Secondary | ICD-10-CM

## 2019-10-14 DIAGNOSIS — Z124 Encounter for screening for malignant neoplasm of cervix: Secondary | ICD-10-CM | POA: Insufficient documentation

## 2019-10-14 DIAGNOSIS — Z3009 Encounter for other general counseling and advice on contraception: Secondary | ICD-10-CM

## 2019-10-14 NOTE — Progress Notes (Signed)
29 y.o. [id number] Single White or Caucasian Not Hispanic or Latino female here for annual exam. Patient states that she has a hard lump at the top of the opening of her vagina. She states that it is rough and bumpy. She does not have any pain. She denies any discharge. Not currently sexually active. She was with her ex (kids father) in December. He has had another partner.   She noticed this hard area in her vaginal opening 6 weeks ago, not tender, hasn't gone away. It is firm with texture.   She has some urgency to void. Some frequency, voiding normal amounts, nocuturia x 1 (new). No pain.   She has a nexplanon, inserted in 4/20. She is interested in sterilization. She is spotting a lot with the nexplanon, feels the nexplanon may worsen her depression. Having trouble loosing weight. She sits during the day at work, works as a Chief Operating Officer at nights. Tries to get 8,000 steps a day. She is watching her calories.  Period Cycle (Days): 30 Period Duration (Days): 5 Period Pattern: Regular Menstrual Flow: Light, Moderate Menstrual Control: Tampon Menstrual Control Change Freq (Hours): 3-4 Dysmenorrhea: (!) Mild Dysmenorrhea Symptoms: Other (Comment)(Lower back discomfort)  Patient's last menstrual period was 10/12/2019.          Sexually active: No.  The current method of family planning is Neplanon.    Exercising: Yes.    Walking  Smoker:  no  Health Maintenance: Pap:  10/14/13 normal  History of abnormal Pap:  no TDaP:  08/30/12 Gardasil: Completed in [employer] .    reports that she has never smoked. She has never used smokeless tobacco. She reports current alcohol use of about 3.0 standard drinks of alcohol per week. She reports that she does not use drugs. 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 Kids are 54.82 year old boy and 53 year old girl.   Past Medical History:  Diagnosis Date  . Anemia   . Anxiety   . Depression   Doing better, has been in therapy.  Past Surgical History:  Procedure  Laterality Date  . EYE SURGERY     for lazy eye  . nexplanon     insertion 2/14, removed 2017  . WISDOM TOOTH EXTRACTION      Current Outpatient Medications  Medication Sig Dispense Refill  . fluticasone (FLONASE) 50 MCG/ACT nasal spray daily as needed.     No current facility-administered medications for this visit.    Family History  Problem Relation Age of Onset  . Diabetes Father   . Heart attack Father   . Cancer Father        leaukemia  . COPD Maternal Grandmother   . Cancer Maternal Grandmother        bone  . Stroke Maternal Grandfather   . Other Neg Hx     Review of Systems  All other systems reviewed and are negative.   Exam:   BP 108/62   Pulse 68   Temp 98.4 F (36.9 C)   Ht 5' 2.5" (1.588 m)   Wt 169 lb (76.7 kg)   LMP 10/12/2019   SpO2 98%   BMI 30.42 kg/m   Weight change: @WEIGHTCHANGE @ Height:   Height: 5' 2.5" (158.8 cm)  Ht Readings from Last 3 Encounters:  10/14/19 5' 2.5" (1.588 m)  11/27/18 5\' 2"  (1.575 m)  09/18/17 5\' 3"  (1.6 m)    General appearance: alert, cooperative and appears stated age Head: Normocephalic, without obvious abnormality, atraumatic Neck: no adenopathy,  supple, symmetrical, trachea midline and thyroid normal to inspection and palpation Lungs: clear to auscultation bilaterally Cardiovascular: regular rate and rhythm Breasts: normal appearance, no masses or tenderness Abdomen: soft, non-tender; non distended,  no masses,  no organomegaly Extremities: extremities normal, atraumatic, no cyanosis or edema Skin: Skin color, texture, turgor normal. No rashes or lesions Lymph nodes: Cervical, supraclavicular, and axillary nodes normal. No abnormal inguinal nodes palpated Neurologic: Grossly normal   Pelvic: External genitalia:  no lesions, area of concern is normal vaginal tissue.              Urethra:  normal appearing urethra with no masses, tenderness or lesions              Bartholins and Skenes: normal                  Vagina: normal appearing vagina with normal color and discharge, no lesions              Cervix: no lesions               Bimanual Exam:  Uterus:  normal size, contour, position, consistency, mobility, non-tender              Adnexa: no mass, fullness, tenderness               Rectovaginal: Confirms               Anus:  normal sphincter tone, no lesions  Gae Dry chaperoned for the exam.  A:  Well Woman with normal exam  Desires sterilization, discussed options. She is sure she doesn't want any more children  Can't loose weight  Vit d def  Urinary frequency and urgency  P:   Pap with reflex hpv  STD testing  Screening labs, vit d, TSH  Urine for ua, c&s  Discussed breast self exam  Discussed calcium and vit D intake  Will schedule laparoscopic salpingectomies (discussed options). Reviewed risks and recovery.

## 2019-10-14 NOTE — Patient Instructions (Signed)
EXERCISE AND DIET:  We recommended that you start or continue a regular exercise program for good health. Regular exercise means any activity that makes your heart beat faster and makes you sweat.  We recommend exercising at least 30 minutes per day at least 3 days a week, preferably 4 or 5.  We also recommend a diet low in fat and sugar.  Inactivity, poor dietary choices and obesity can cause diabetes, heart attack, stroke, and kidney damage, among others.    ALCOHOL AND SMOKING:  Women should limit their alcohol intake to no more than 7 drinks/beers/glasses of wine (combined, not each!) per week. Moderation of alcohol intake to this level decreases your risk of breast cancer and liver damage. And of course, no recreational drugs are part of a healthy lifestyle.  And absolutely no smoking or even second hand smoke. Most people know smoking can cause heart and lung diseases, but did you know it also contributes to weakening of your bones? Aging of your skin?  Yellowing of your teeth and nails?  CALCIUM AND VITAMIN D:  Adequate intake of calcium and Vitamin D are recommended.  The recommendations for exact amounts of these supplements seem to change often, but generally speaking 1,000 mg of calcium (between diet and supplement) and 800 units of Vitamin D per day seems prudent. Certain women may benefit from higher intake of Vitamin D.  If you are among these women, your doctor will have told you during your visit.    PAP SMEARS:  Pap smears, to check for cervical cancer or precancers,  have traditionally been done yearly, although recent scientific advances have shown that most women can have pap smears less often.  However, every woman still should have a physical exam from her gynecologist every year. It will include a breast check, inspection of the vulva and vagina to check for abnormal growths or skin changes, a visual exam of the cervix, and then an exam to evaluate the size and shape of the uterus and  ovaries.  And after 30 years of age, a rectal exam is indicated to check for rectal cancers. We will also provide age appropriate advice regarding health maintenance, like when you should have certain vaccines, screening for sexually transmitted diseases, bone density testing, colonoscopy, mammograms, etc.   MAMMOGRAMS:  All women over 40 years old should have a yearly mammogram. Many facilities now offer a "3D" mammogram, which may cost around $50 extra out of pocket. If possible,  we recommend you accept the option to have the 3D mammogram performed.  It both reduces the number of women who will be called back for extra views which then turn out to be normal, and it is better than the routine mammogram at detecting truly abnormal areas.    COLON CANCER SCREENING: Now recommend starting at age 45. At this time colonoscopy is not covered for routine screening until 50. There are take home tests that can be done between 45-49.   COLONOSCOPY:  Colonoscopy to screen for colon cancer is recommended for all women at age 50.  We know, you hate the idea of the prep.  We agree, BUT, having colon cancer and not knowing it is worse!!  Colon cancer so often starts as a polyp that can be seen and removed at colonscopy, which can quite literally save your life!  And if your first colonoscopy is normal and you have no family history of colon cancer, most women don't have to have it again for   10 years.  Once every ten years, you can do something that may end up saving your life, right?  We will be happy to help you get it scheduled when you are ready.  Be sure to check your insurance coverage so you understand how much it will cost.  It may be covered as a preventative service at no cost, but you should check your particular policy.      Breast Self-Awareness Breast self-awareness means being familiar with how your breasts look and feel. It involves checking your breasts regularly and reporting any changes to your  health care provider. Practicing breast self-awareness is important. A change in your breasts can be a sign of a serious medical problem. Being familiar with how your breasts look and feel allows you to find any problems early, when treatment is more likely to be successful. All women should practice breast self-awareness, including women who have had breast implants. How to do a breast self-exam One way to learn what is normal for your breasts and whether your breasts are changing is to do a breast self-exam. To do a breast self-exam: Look for Changes  1. Remove all the clothing above your waist. 2. Stand in front of a mirror in a room with good lighting. 3. Put your hands on your hips. 4. Push your hands firmly downward. 5. Compare your breasts in the mirror. Look for differences between them (asymmetry), such as: ? Differences in shape. ? Differences in size. ? Puckers, dips, and bumps in one breast and not the other. 6. Look at each breast for changes in your skin, such as: ? Redness. ? Scaly areas. 7. Look for changes in your nipples, such as: ? Discharge. ? Bleeding. ? Dimpling. ? Redness. ? A change in position. Feel for Changes Carefully feel your breasts for lumps and changes. It is best to do this while lying on your back on the floor and again while sitting or standing in the shower or tub with soapy water on your skin. Feel each breast in the following way:  Place the arm on the side of the breast you are examining above your head.  Feel your breast with the other hand.  Start in the nipple area and make  inch (2 cm) overlapping circles to feel your breast. Use the pads of your three middle fingers to do this. Apply light pressure, then medium pressure, then firm pressure. The light pressure will allow you to feel the tissue closest to the skin. The medium pressure will allow you to feel the tissue that is a little deeper. The firm pressure will allow you to feel the tissue  close to the ribs.  Continue the overlapping circles, moving downward over the breast until you feel your ribs below your breast.  Move one finger-width toward the center of the body. Continue to use the  inch (2 cm) overlapping circles to feel your breast as you move slowly up toward your collarbone.  Continue the up and down exam using all three pressures until you reach your armpit.  Write Down What You Find  Write down what is normal for each breast and any changes that you find. Keep a written record with breast changes or normal findings for each breast. By writing this information down, you do not need to depend only on memory for size, tenderness, or location. Write down where you are in your menstrual cycle, if you are still menstruating. If you are having trouble noticing differences   in your breasts, do not get discouraged. With time you will become more familiar with the variations in your breasts and more comfortable with the exam. How often should I examine my breasts? Examine your breasts every month. If you are breastfeeding, the best time to examine your breasts is after a feeding or after using a breast pump. If you menstruate, the best time to examine your breasts is 5-7 days after your period is over. During your period, your breasts are lumpier, and it may be more difficult to notice changes. When should I see my health care provider? See your health care provider if you notice:  A change in shape or size of your breasts or nipples.  A change in the skin of your breast or nipples, such as a reddened or scaly area.  Unusual discharge from your nipples.  A lump or thick area that was not there before.  Pain in your breasts.  Anything that concerns you.  Laparoscopic Tubal Ligation Laparoscopic tubal ligation is a procedure to close the fallopian tubes. This is done so that you cannot get pregnant. When the fallopian tubes are closed, the eggs that your ovaries release  cannot enter the uterus, and sperm cannot reach the released eggs. You should not have this procedure if you want to get pregnant someday or if you are unsure about having more children. Tell a health care provider about:  Any allergies you have.  All medicines you are taking, including vitamins, herbs, eye drops, creams, and over-the-counter medicines.  Any problems you or family members have had with anesthetic medicines.  Any blood disorders you have.  Any surgeries you have had.  Any medical conditions you have.  Whether you are pregnant or may be pregnant.  Any past pregnancies. What are the risks? Generally, this is a safe procedure. However, problems may occur, including:  Infection.  Bleeding.  Injury to other organs in the abdomen.  Side effects from anesthetic medicines.  Failure of the procedure. This procedure can increase your risk of a kind of pregnancy in which a fertilized egg attaches to the outside of the uterus (ectopic pregnancy). What happens before the procedure? Medicines  Ask your health care provider about: ? Changing or stopping your regular medicines. This is especially important if you are taking diabetes medicines or blood thinners. ? Taking medicines such as aspirin and ibuprofen. These medicines can thin your blood. Do not take these medicines unless your health care provider tells you to take them. ? Taking over-the-counter medicines, vitamins, herbs, and supplements. Staying hydrated  Follow instructions from your health care provider about hydration, which may include: ? Up to 2 hours before the procedure - you may continue to drink clear liquids, such as water, clear fruit juice, black coffee, and plain tea. Eating and drinking  Follow instructions from your health care provider about eating and drinking, which may include: ? 8 hours before the procedure - stop eating heavy meals or foods, such as meat, fried foods, or fatty foods. ? 6  hours before the procedure - stop eating light meals or foods, such as toast or cereal. ? 6 hours before the procedure - stop drinking milk or drinks that contain milk. ? 2 hours before the procedure - stop drinking clear liquids. General instructions  Do not use any products that contain nicotine or tobacco for at least 4 weeks before the procedure. These products include cigarettes, e-cigarettes, and chewing tobacco. If you need help quitting, ask your  health care provider.  Plan to have someone take you home from the hospital.  If you will be going home right after the procedure, plan to have someone with you for 24 hours.  Ask your health care provider: ? How your surgery site will be marked. ? What steps will be taken to help prevent infection. These may include:  Removing hair at the surgery site.  Washing skin with a germ-killing soap.  Taking antibiotic medicine. What happens during the procedure?      An IV will be inserted into one of your veins.  You will be given one or more of the following: ? A medicine to help you relax (sedative). ? A medicine to numb the area (local anesthetic). ? A medicine to make you fall asleep (general anesthetic). ? A medicine that is injected into an area of your body to numb everything below the injection site (regional anesthetic).  Your bladder may be emptied with a small tube (catheter).  If you have been given a general anesthetic, a tube will be put down your throat to help you breathe.  Two small incisions will be made in your lower abdomen and near your belly button.  Your abdomen will be inflated with a gas. This will let the surgeon see better and will give the surgeon room to work.  A thin, lighted tube (laparoscope) with a camera attached will be inserted into your abdomen through one of the incisions. Small instruments will be inserted through the other incision.  The fallopian tubes will be tied off, burned  (cauterized), or blocked with a clip, ring, or clamp. A small portion in the center of each fallopian tube may be removed.  The gas will be released from the abdomen.  The incisions will be closed with stitches (sutures).  A bandage (dressing) will be placed over the incisions. The procedure may vary among health care providers and hospitals. What happens after the procedure?  Your blood pressure, heart rate, breathing rate, and blood oxygen level will be monitored until you leave the hospital.  You will be given medicine to help with pain, nausea, and vomiting as needed. Summary  Laparoscopic tubal ligation is a procedure that is done so that you cannot get pregnant.  You should not have this procedure if you want to get pregnant someday or if you are unsure about having more children.  The procedure is done using a thin, lighted tube (laparoscope) with a camera attached that will be inserted into your abdomen through an incision.  Follow instructions from your health care provider about eating and drinking before the procedure. This information is not intended to replace advice given to you by your health care provider. Make sure you discuss any questions you have with your health care provider. Document Revised: 01/13/2019 Document Reviewed: 07/01/2018 Elsevier Patient Education  2020 [employer].

## 2019-10-15 ENCOUNTER — Telehealth: Payer: Self-pay | Admitting: Obstetrics and Gynecology

## 2019-10-15 ENCOUNTER — Other Ambulatory Visit: Payer: Self-pay

## 2019-10-15 ENCOUNTER — Telehealth: Payer: Self-pay

## 2019-10-15 ENCOUNTER — Other Ambulatory Visit: Payer: Self-pay | Admitting: *Deleted

## 2019-10-15 DIAGNOSIS — Z0189 Encounter for other specified special examinations: Secondary | ICD-10-CM

## 2019-10-15 DIAGNOSIS — E559 Vitamin D deficiency, unspecified: Secondary | ICD-10-CM

## 2019-10-15 LAB — HEMOGLOBIN A1C
Est. average glucose Bld gHb Est-mCnc: 100 mg/dL
Hgb A1c MFr Bld: 5.1 % (ref 4.8–5.6)

## 2019-10-15 LAB — COMPREHENSIVE METABOLIC PANEL
ALT: 11 IU/L (ref 0–32)
AST: 12 IU/L (ref 0–40)
Albumin/Globulin Ratio: 2.2 (ref 1.2–2.2)
Albumin: 4.3 g/dL (ref 3.9–5.0)
Alkaline Phosphatase: 44 IU/L (ref 39–117)
BUN/Creatinine Ratio: 21 (ref 9–23)
BUN: 13 mg/dL (ref 6–20)
Bilirubin Total: 0.4 mg/dL (ref 0.0–1.2)
CO2: 23 mmol/L (ref 20–29)
Calcium: 8.5 mg/dL — ABNORMAL LOW (ref 8.7–10.2)
Chloride: 106 mmol/L (ref 96–106)
Creatinine, Ser: 0.61 mg/dL (ref 0.57–1.00)
GFR calc Af Amer: 142 mL/min/{1.73_m2} (ref >59)
GFR calc non Af Amer: 123 mL/min/{1.73_m2} (ref >59)
Globulin, Total: 2 g/dL (ref 1.5–4.5)
Glucose: 88 mg/dL (ref 65–99)
Potassium: 4.3 mmol/L (ref 3.5–5.2)
Sodium: 142 mmol/L (ref 134–144)
Total Protein: 6.3 g/dL (ref 6.0–8.5)

## 2019-10-15 LAB — URINALYSIS, MICROSCOPIC ONLY
Bacteria, UA: NONE SEEN
Casts: NONE SEEN /lpf

## 2019-10-15 LAB — CYTOLOGY - PAP
Chlamydia: POSITIVE — AB
Comment: NEGATIVE
Comment: NEGATIVE
Comment: NORMAL
Diagnosis: NEGATIVE
Neisseria Gonorrhea: NEGATIVE
Trichomonas: NEGATIVE

## 2019-10-15 LAB — CBC
Hematocrit: 38.1 % (ref 34.0–46.6)
Hemoglobin: 12.3 g/dL (ref 11.1–15.9)
MCH: 29.4 pg (ref 26.6–33.0)
MCHC: 32.3 g/dL (ref 31.5–35.7)
MCV: 91 fL (ref 79–97)
Platelets: 219 10*3/uL (ref 150–450)
RBC: 4.19 x10E6/uL (ref 3.77–5.28)
RDW: 12.2 % (ref 11.7–15.4)
WBC: 5.6 10*3/uL (ref 3.4–10.8)

## 2019-10-15 LAB — LIPID PANEL
Chol/HDL Ratio: 3.3 ratio (ref 0.0–4.4)
Cholesterol, Total: 145 mg/dL (ref 100–199)
HDL: 44 mg/dL (ref >39)
LDL Chol Calc (NIH): 89 mg/dL (ref 0–99)
Triglycerides: 55 mg/dL (ref 0–149)
VLDL Cholesterol Cal: 12 mg/dL (ref 5–40)

## 2019-10-15 LAB — HIV ANTIBODY (ROUTINE TESTING W REFLEX): HIV Screen 4th Generation wRfx: NONREACTIVE

## 2019-10-15 LAB — TSH: TSH: 1.55 u[IU]/mL (ref 0.450–4.500)

## 2019-10-15 LAB — VITAMIN D 25 HYDROXY (VIT D DEFICIENCY, FRACTURES): Vit D, 25-Hydroxy: 22.5 ng/mL — ABNORMAL LOW (ref 30.0–100.0)

## 2019-10-15 LAB — RPR: RPR Ser Ql: NONREACTIVE

## 2019-10-15 MED ORDER — AZITHROMYCIN 1 G PO PACK: 1.0000 | PACK | Freq: Once | ORAL | 0 refills | Status: AC

## 2019-10-15 NOTE — Telephone Encounter (Signed)
Left message at number on DPR informing her of results per Dr. Talbert Nan below. Patient instructed to return call scheduling 3 month lab.     Salvadore Dom, MD  Harlan Stains  Cc: Yates Decamp, CMA  Lucy: Can you please add a PTH onto her blood work?  Demondre Aguas: please let the patient know that her calcium level and vit d are both low. I'm trying to add on more blood work. She should be getting at least 1,000 mg of calcium daily (between diet and supplements). I would recommend that she start taking 1,000 IU of vit D 3 daily and that we recheck her calcium, albumin and vit d levels in 3 months.  The rest of her blood work is normal. Her urinalysis is normal. The pap, cervical cultures and urine culture are pending.

## 2019-10-15 NOTE — Telephone Encounter (Signed)
Call to patient. Per DPR, OK to leave message on voicemail.   Left voicemail requesting a return call to [facility] to review surgery benefits.

## 2019-10-15 NOTE — Telephone Encounter (Signed)
Second call made to patient to schedule PTH labs. Voice mail box is full unable to leave message.

## 2019-10-16 LAB — URINE CULTURE: Organism ID, Bacteria: NO GROWTH

## 2019-10-20 ENCOUNTER — Other Ambulatory Visit (INDEPENDENT_AMBULATORY_CARE_PROVIDER_SITE_OTHER): Payer: BC Managed Care – PPO

## 2019-10-20 ENCOUNTER — Other Ambulatory Visit: Payer: Self-pay

## 2019-10-20 NOTE — Telephone Encounter (Signed)
Spoke with patient regarding surgery benefits. Patient acknowledges understanding of information presented. Patient is aware that benefits presented are for professional benefits only. Patient is aware that once surgery is scheduled, the hospital will call with separate benefits. Patient is aware of surgery cancellation policy. ° °Patient is ready to proceed with scheduling. °

## 2019-10-21 LAB — PTH, INTACT AND CALCIUM
Calcium: 9.2 mg/dL (ref 8.7–10.2)
PTH: 21 pg/mL (ref 15–65)

## 2019-10-21 NOTE — Telephone Encounter (Signed)
Left message to call Alyan Hartline at [phone]. 

## 2019-10-22 NOTE — Telephone Encounter (Signed)
Spoke with patient. Patient would like to speak with her employer and pre service center before proceeding with scheduling. She will return call to schedule.

## 2019-10-22 NOTE — Telephone Encounter (Signed)
Spoke with patient results given.

## 2019-11-05 NOTE — Telephone Encounter (Signed)
Spoke with patient. Patient is still in the process of speaking with employer and pre service center and will return call once she would like to proceed with scheduling.

## 2019-11-11 ENCOUNTER — Encounter: Payer: Self-pay | Admitting: Certified Nurse Midwife

## 2019-11-19 NOTE — Telephone Encounter (Signed)
Left message to call [name] at [phone]. 

## 2019-12-01 NOTE — Telephone Encounter (Signed)
Left message to call Jnae Thomaston at [phone]. 

## 2019-12-09 NOTE — Telephone Encounter (Signed)
Dr.Jertson, patient has not returned call x 2 regarding surgery planning. Please advise.

## 2019-12-09 NOTE — Telephone Encounter (Signed)
Will close encounter

## 2020-01-13 ENCOUNTER — Other Ambulatory Visit: Payer: Self-pay

## 2020-01-14 ENCOUNTER — Encounter: Payer: Self-pay | Admitting: Obstetrics and Gynecology

## 2020-01-14 ENCOUNTER — Ambulatory Visit (INDEPENDENT_AMBULATORY_CARE_PROVIDER_SITE_OTHER): Payer: BC Managed Care – PPO | Admitting: Obstetrics and Gynecology

## 2020-01-14 VITALS — BP 120/78 | HR 84 | Temp 98.4°F | Wt 173.4 lb

## 2020-01-14 DIAGNOSIS — Z113 Encounter for screening for infections with a predominantly sexual mode of transmission: Secondary | ICD-10-CM | POA: Diagnosis not present

## 2020-01-14 DIAGNOSIS — E559 Vitamin D deficiency, unspecified: Secondary | ICD-10-CM

## 2020-01-14 DIAGNOSIS — Z8619 Personal history of other infectious and parasitic diseases: Secondary | ICD-10-CM | POA: Diagnosis not present

## 2020-01-14 NOTE — Patient Instructions (Signed)
Chlamydia, Female Chlamydia is an STD (sexually transmitted disease). It is a bacterial infection that spreads (is contagious) through sexual contact. Chlamydia can occur in different areas of the body, including:  The tube that moves urine from the bladder out of the body (urethra).  The lower part of the uterus (cervix).  The throat.  The rectum. This condition is not difficult to treat. However, if left untreated, chlamydia can lead to more serious health problems, including pelvic inflammatory disorder (PID). PID can increase your risk of not being able to have children (sterility). Also, if chlamydia is left untreated and you are pregnant or become pregnant, there is a chance that your baby can become infected during delivery. This may cause serious health problems for the baby. What are the causes? Chlamydia is caused by the bacteria Chlamydia trachomatis. It is passed from an infected partner during sexual activity. Chlamydia can spread through contact with the genitals, mouth, or rectum. What are the signs or symptoms? In some cases, there may not be any symptoms for this condition (asymptomatic), especially early in the infection. If symptoms develop, they may include:  Burning with urination.  Frequent urination.  Vaginal discharge.  Redness, soreness, and swelling (inflammation) of the rectum.  Bleeding or discharge from the rectum.  Abdominal pain.  Pain during sexual intercourse.  Bleeding between menstrual periods.  Itching, burning, or redness in the eyes, or discharge from the eyes. How is this diagnosed? This condition may be diagnosed with:  Urine tests.  Swab tests. Depending on your symptoms, your health care provider may use a cotton swab to collect discharge from your vagina or rectum to test for the bacteria.  A pelvic exam. How is this treated? This condition is treated with antibiotic medicines. If you are pregnant, certain types of antibiotics will  need to be avoided. Follow these instructions at home: Medicines  Take over-the-counter and prescription medicines only as told by your health care provider.  Take your antibiotic medicine as told by your health care provider. Do not stop taking the antibiotic even if you start to feel better. Sexual activity  Tell sexual partners about your infection. This includes any oral, anal, or vaginal sex partners you have had within 60 days of when your symptoms started. Sexual partners should also be treated, even if they have no signs of the disease.  Do not have sex until you and your sexual partners have completed treatment and your health care provider says it is okay. If your health care provider prescribed you a single dose treatment, wait 7 days after taking the treatment before having sex. General instructions  It is your responsibility to get your test results. Ask your health care provider, or the department performing the test, when your results will be ready.  Get plenty of rest.  Eat a healthy, well-balanced diet.  Drink enough fluids to keep your urine clear or pale yellow.  Keep all follow-up visits as told by your health care provider. This is important. You may need to be tested for infection again 3 months after treatment. How is this prevented? The only sure way to prevent chlamydia is to avoid having sex. However, you can lower your risk by:  Using latex condoms correctly every time you have sex.  Not having multiple sexual partners.  Asking if your sexual partner has been tested for STIs and had negative results. Contact a health care provider if:  You develop new symptoms or your symptoms do not get  better after completing treatment.  You have a fever or chills.  You have pain during sexual intercourse. Get help right away if:  Your pain gets worse and does not get better with medicine.  You develop flu-like symptoms, such as night sweats, sore throat, or  muscle aches.  You experience nausea or vomiting.  You have difficulty swallowing.  You have bleeding between periods or after sex.  You have irregular menstrual periods.  You have abdominal or lower back pain that does not get better with medicine.  You feel weak or dizzy, or you faint.  You are pregnant and you develop symptoms of chlamydia. Summary  Chlamydia is an STD (sexually transmitted disease). It is a bacterial infection that spreads (is contagious) through sexual contact.  This condition is not difficult to treat, however. If left untreated, chlamydia can lead to more serious health problems, including pelvic inflammatory disease (PID).  In some cases, there may not be any symptoms for this condition (asymptomatic).  This condition is treated with antibiotic medicines.  Using latex condoms correctly every time you have sex can help prevent chlamydia. This information is not intended to replace advice given to you by your health care provider. Make sure you discuss any questions you have with your health care provider. Document Revised: 01/28/2018 Document Reviewed: 07/23/2016 Elsevier Patient Education  2020 Elsevier Inc.  

## 2020-01-14 NOTE — Progress Notes (Signed)
GYNECOLOGY  VISIT   HPI: 29 y.o.   Single White or Caucasian Not Hispanic or Latino  female   [phone] with Patient's last menstrual period was 01/13/2020 (exact date).   here for repeat chlamydia testing. She and her partner were treated in 2/21.     GYNECOLOGIC HISTORY: Patient's last menstrual period was 01/13/2020 (exact date). Contraception:Nexplanon Menopausal hormone therapy: na        OB History    Gravida  2   Para  2   Term  2   Preterm  0   AB  0   Living  2     SAB  0   TAB  0   Ectopic  0   Multiple  0   Live Births  2              Patient Active Problem List   Diagnosis Date Noted  . Cervical lymphadenopathy 09/08/2018  . GERD (gastroesophageal reflux disease) 05/05/2015  . Anxiety   . Depression   . Anemia     Past Medical History:  Diagnosis Date  . Anemia   . Anxiety   . Depression     Past Surgical History:  Procedure Laterality Date  . EYE SURGERY     for lazy eye  . nexplanon     insertion 2/14, removed 2017  . WISDOM TOOTH EXTRACTION      Current Outpatient Medications  Medication Sig Dispense Refill  . fluticasone (FLONASE) 50 MCG/ACT nasal spray daily as needed.     No current facility-administered medications for this visit.     ALLERGIES: Patient has no known allergies.  Family History  Problem Relation Age of Onset  . Diabetes Father   . Heart attack Father   . Cancer Father        leaukemia  . COPD Maternal Grandmother   . Cancer Maternal Grandmother        bone  . Stroke Maternal Grandfather   . Other Neg Hx     Social History   Socioeconomic History  . Marital status: Single    Spouse name: Not on file  . Number of children: Not on file  . Years of education: Not on file  . Highest education level: Not on file  Occupational History  . Not on file  Tobacco Use  . Smoking status: Never Smoker  . Smokeless tobacco: Never Used  Substance and Sexual Activity  . Alcohol use: Yes   Alcohol/week: 3.0 standard drinks    Types: 3 Standard drinks or equivalent per week  . Drug use: No  . Sexual activity: Yes    Partners: Male    Birth control/protection: Implant    Comment: nexplanon  Other Topics Concern  . Not on file  Social History Narrative  . Not on file   Social Determinants of Health   Financial Resource Strain:   . Difficulty of Paying Living Expenses:   Food Insecurity:   . Worried About Charity fundraiser in the Last Year:   . Arboriculturist in the Last Year:   Transportation Needs:   . Film/video editor (Medical):   Marland Kitchen Lack of Transportation (Non-Medical):   Physical Activity:   . Days of Exercise per Week:   . Minutes of Exercise per Session:   Stress:   . Feeling of Stress :   Social Connections:   . Frequency of Communication with Friends and Family:   . Frequency of  Social Gatherings with Friends and Family:   . Attends Religious Services:   . Active Member of Clubs or Organizations:   . Attends Archivist Meetings:   Marland Kitchen Marital Status:   Intimate Partner Violence:   . Fear of Current or Ex-Partner:   . Emotionally Abused:   Marland Kitchen Physically Abused:   . Sexually Abused:     Review of Systems  HENT: Positive for tinnitus.   All other systems reviewed and are negative.   PHYSICAL EXAMINATION:    BP 120/78 (BP Location: Right Arm, Patient Position: Sitting, Cuff Size: Normal)   Pulse 84   Temp 98.4 F (36.9 C) (Skin)   Wt 173 lb 6.4 oz (78.7 kg)   LMP 01/13/2020 (Exact Date)   BMI 31.21 kg/m     General appearance: alert, cooperative and appears stated age  Pelvic: External genitalia:  no lesions              Urethra:  normal appearing urethra with no masses, tenderness or lesions              Bartholins and Skenes: normal                 Vagina: normal appearing vagina with normal color and discharge, no lesions              Cervix: no lesions               Chaperone was present for exam.  ASSESSMENT H/O  chlamydia, patient and her partner have been treated H/O low calcium and low vit D. On calcium and vit D (normal PTH)    PLAN STD screening (cervical cultures only). Other STD testing negative. Calcium, albumin, vit D

## 2020-01-15 LAB — ALBUMIN: Albumin: 4.3 g/dL (ref 3.9–5.0)

## 2020-01-15 LAB — CALCIUM: Calcium: 8.8 mg/dL (ref 8.7–10.2)

## 2020-01-15 LAB — VITAMIN D 25 HYDROXY (VIT D DEFICIENCY, FRACTURES): Vit D, 25-Hydroxy: 24.6 ng/mL — ABNORMAL LOW (ref 30.0–100.0)

## 2020-01-16 LAB — NUSWAB VAGINITIS (VG)
Candida albicans, NAA: NEGATIVE
Candida glabrata, NAA: NEGATIVE
Trich vag by NAA: NEGATIVE

## 2020-01-19 ENCOUNTER — Other Ambulatory Visit: Payer: Self-pay

## 2020-01-19 DIAGNOSIS — E559 Vitamin D deficiency, unspecified: Secondary | ICD-10-CM

## 2020-03-30 ENCOUNTER — Other Ambulatory Visit: Payer: Self-pay

## 2020-03-30 ENCOUNTER — Ambulatory Visit: Payer: BLUE CROSS/BLUE SHIELD | Admitting: Physician Assistant

## 2020-03-30 ENCOUNTER — Encounter: Payer: Self-pay | Admitting: Physician Assistant

## 2020-03-30 VITALS — BP 126/72 | HR 83 | Temp 97.5°F | Wt 181.0 lb

## 2020-03-30 DIAGNOSIS — H65491 Other chronic nonsuppurative otitis media, right ear: Secondary | ICD-10-CM

## 2020-03-30 DIAGNOSIS — R059 Cough, unspecified: Secondary | ICD-10-CM

## 2020-03-30 DIAGNOSIS — R05 Cough: Secondary | ICD-10-CM

## 2020-03-30 DIAGNOSIS — E559 Vitamin D deficiency, unspecified: Secondary | ICD-10-CM | POA: Diagnosis not present

## 2020-03-30 DIAGNOSIS — Z79899 Other long term (current) drug therapy: Secondary | ICD-10-CM | POA: Diagnosis not present

## 2020-03-30 DIAGNOSIS — M79642 Pain in left hand: Secondary | ICD-10-CM | POA: Diagnosis not present

## 2020-03-30 DIAGNOSIS — M79641 Pain in right hand: Secondary | ICD-10-CM

## 2020-03-30 NOTE — Progress Notes (Signed)
Subjective:    Patient ID: Jade Webster, female    DOB: Apr 17, 1991, 29 y.o.   MRN: [id number]  HPI 29 y.o. WF with history of depression,anxiety and anemia presents with right ear pain and is concerned about radon exposure in her condo.   She is selling her condo, radon level was 9 at her condo with first test. She is not a smoker, has no respiratory symptoms but does have sinus symptoms. She has recurrent sinus infections, last year she has been better with mask wearing and avoiding people, however she has been having right ear pain and occ feeling of thick mucus in her throat.   Has seen ENT in the past for lymph node on right neck, was normal.   She has left [employer] and is relocating to [place] with her husband and children.   Grandmother with RA. She has bilateral hand pain with MCP/PIP, stiffness in AM and throughout the day for at least an hour, bilateral feet pain, lower back pain. She has no rashes, no eye symptoms. Had negative HLAB27 In the past.   Blood pressure 126/72, pulse 83, temperature (!) 97.5 F (36.4 C), weight 181 lb (82.1 kg), last menstrual period 03/26/2020, SpO2 98 %, unknown if currently breastfeeding.  Medications    Current Outpatient Medications (Respiratory):    fluticasone (FLONASE) 50 MCG/ACT nasal spray, daily as needed.     Problem list She has Anxiety; Depression; Anemia; GERD (gastroesophageal reflux disease); and Cervical lymphadenopathy on their problem list.   Review of Systems  Constitutional: Negative.  Negative for chills, fatigue and fever.  HENT: Positive for ear pain, postnasal drip and sore throat. Negative for congestion, dental problem, drooling, ear discharge, facial swelling, hearing loss, mouth sores, nosebleeds, rhinorrhea, sinus pressure, sneezing, tinnitus and trouble swallowing.   Eyes: Negative.  Negative for visual disturbance.  Respiratory: Negative for apnea, cough, choking, chest tightness, shortness of breath,  wheezing and stridor.   Cardiovascular: Negative.   Genitourinary: Negative.   Musculoskeletal: Positive for arthralgias, back pain and joint swelling. Negative for gait problem, myalgias, neck pain and neck stiffness.  Neurological: Positive for headaches. Negative for dizziness, tremors, seizures, syncope, facial asymmetry, speech difficulty, weakness, light-headedness and numbness.       Objective:   Physical Exam Constitutional:      General: She is not in acute distress.    Appearance: Normal appearance. She is well-developed.  HENT:     Head: Normocephalic and atraumatic.     Right Ear: Hearing and external ear normal. A middle ear effusion is present. No mastoid tenderness. Tympanic membrane is not injected, erythematous, retracted or bulging.     Left Ear: Hearing and external ear normal. A middle ear effusion is present. No mastoid tenderness. Tympanic membrane is not injected, erythematous, retracted or bulging.  Eyes:     Conjunctiva/sclera: Conjunctivae normal.     Pupils: Pupils are equal, round, and reactive to light.  Cardiovascular:     Rate and Rhythm: Normal rate.  Pulmonary:     Effort: Pulmonary effort is normal.     Breath sounds: Normal breath sounds.  Musculoskeletal:        General: Normal range of motion.     Cervical back: Normal range of motion and neck supple.     Comments: No obvious erythema/swelling bilateral hands, no nodules. Full ROM and good grip bilateral hands.   Skin:    General: Skin is warm and dry.  Neurological:  Mental Status: She is alert and oriented to person, place, and time.        Assessment & Plan:  :  Chronic otitis media of right ear with effusion Flonase, autoinflation, if not better can refer to ENT  Cough/Radon exposure Patient is about to lose insurance, discussed that radon exposure is not something acute but has a possible association with lung cancer in non smokers.  -     DG Chest 2 View; Future  Medication  management -     CBC with Differential/Platelet -     COMPLETE METABOLIC PANEL WITH GFR -     TSH  Vitamin D deficiency -     VITAMIN D 25 Hydroxy (Vit-D Deficiency, Fractures)  Bilateral hand pain fam hx of RA, + stiffness in the AM, no definite swelling on exam Will check labs -     ANA -     Anti-DNA antibody, double-stranded -     Rheumatoid factor -     C-reactive protein -     ANCA screen with reflex titer -     RNP Antibody

## 2020-03-30 NOTE — Patient Instructions (Addendum)
INFORMATION ABOUT YOUR XRAY [facility] Can walk into [address] building for an Insurance account manager. They will have the order and take you back. You do not any paper work, I should get the result back today or tomorrow. This order is good for a year.  Can call [phone] to schedule an appointment if you wish.   Your ears and sinuses are connected by the eustachian tube. When your sinuses are inflamed, this can close off the tube and cause fluid to collect in your middle ear. This can then cause dizziness, popping, clicking, ringing, and echoing in your ears. This is often NOT an infection and does NOT require antibiotics, it is caused by inflammation so the treatments help the inflammation. This can take a long time to get better so please be patient.  Here are things you can do to help with this: - Try the Flonase or Nasonex. Remember to spray each nostril twice towards the outer part of your eye.  Do not sniff but instead pinch your nose and tilt your head back to help the medicine get into your sinuses.  The best time to do this is at bedtime.Stop if you get blurred vision or nose bleeds.  -While drinking fluids, pinch and hold nose close and swallow, to help open eustachian tubes to drain fluid behind ear drums. -Please pick one of the over the counter allergy medications below and take it once daily for allergies.  It will also help with fluid behind ear drums. Claritin or loratadine cheapest but likely the weakest  Zyrtec or certizine at night because it can make you sleepy The strongest is allegra or fexafinadine  Cheapest at [facility], [employer], [employer] -can use decongestant over the counter, please do not use if you have high blood pressure or certain heart conditions.   if worsening HA, changes vision/speech, imbalance, weakness go to the ER  If not better can refer to ENT to evaluate for tubes   Eustachian Tube Dysfunction  Eustachian tube dysfunction refers to a condition in which a  blockage develops in the narrow passage that connects the middle ear to the back of the nose (eustachian tube). The eustachian tube regulates air pressure in the middle ear by letting air move between the ear and nose. It also helps to drain fluid from the middle ear space. Eustachian tube dysfunction can affect one or both ears. When the eustachian tube does not function properly, air pressure, fluid, or both can build up in the middle ear. What are the causes? This condition occurs when the eustachian tube becomes blocked or cannot open normally. Common causes of this condition include:  Ear infections.  Colds and other infections that affect the nose, mouth, and throat (upper respiratory tract).  Allergies.  Irritation from cigarette smoke.  Irritation from stomach acid coming up into the esophagus (gastroesophageal reflux). The esophagus is the tube that carries food from the mouth to the stomach.  Sudden changes in air pressure, such as from descending in an airplane or scuba diving.  Abnormal growths in the nose or throat, such as: ? Growths that line the nose (nasal polyps). ? Abnormal growth of cells (tumors). ? Enlarged tissue at the back of the throat (adenoids). What increases the risk? You are more likely to develop this condition if:  You smoke.  You are overweight.  You are a child who has: ? Certain birth defects of the mouth, such as cleft palate. ? Large tonsils or adenoids. What are the  signs or symptoms? Common symptoms of this condition include:  A feeling of fullness in the ear.  Ear pain.  Clicking or popping noises in the ear.  Ringing in the ear.  Hearing loss.  Loss of balance.  Dizziness. Symptoms may get worse when the air pressure around you changes, such as when you travel to an area of high elevation, fly on an airplane, or go scuba diving. How is this diagnosed? This condition may be diagnosed based on:  Your symptoms.  A physical  exam of your ears, nose, and throat.  Tests, such as those that measure: ? The movement of your eardrum (tympanogram). ? Your hearing (audiometry). How is this treated? Treatment depends on the cause and severity of your condition.  In mild cases, you may relieve your symptoms by moving air into your ears. This is called "popping the ears."  In more severe cases, or if you have symptoms of fluid in your ears, treatment may include: ? Medicines to relieve congestion (decongestants). ? Medicines that treat allergies (antihistamines). ? Nasal sprays or ear drops that contain medicines that reduce swelling (steroids). ? A procedure to drain the fluid in your eardrum (myringotomy). In this procedure, a small tube is placed in the eardrum to:  Drain the fluid.  Restore the air in the middle ear space. ? A procedure to insert a balloon device through the nose to inflate the opening of the eustachian tube (balloon dilation). Follow these instructions at home: Lifestyle  Do not do any of the following until your health care provider approves: ? Travel to high altitudes. ? Fly in airplanes. ? Work in a Pension scheme manager or room. ? Scuba dive.  Do not use any products that contain nicotine or tobacco, such as cigarettes and e-cigarettes. If you need help quitting, ask your health care provider.  Keep your ears dry. Wear fitted earplugs during showering and bathing. Dry your ears completely after. General instructions  Take over-the-counter and prescription medicines only as told by your health care provider.  Use techniques to help pop your ears as recommended by your health care provider. These may include: ? Chewing gum. ? Yawning. ? Frequent, forceful swallowing. ? Closing your mouth, holding your nose closed, and gently blowing as if you are trying to blow air out of your nose.  Keep all follow-up visits as told by your health care provider. This is important. Contact a health  care provider if:  Your symptoms do not go away after treatment.  Your symptoms come back after treatment.  You are unable to pop your ears.  You have: ? A fever. ? Pain in your ear. ? Pain in your head or neck. ? Fluid draining from your ear.  Your hearing suddenly changes.  You become very dizzy.  You lose your balance. Summary  Eustachian tube dysfunction refers to a condition in which a blockage develops in the eustachian tube.  It can be caused by ear infections, allergies, inhaled irritants, or abnormal growths in the nose or throat.  Symptoms include ear pain, hearing loss, or ringing in the ears.  Mild cases are treated with maneuvers to unblock the ears, such as yawning or ear popping.  Severe cases are treated with medicines. Surgery may also be done (rare). This information is not intended to replace advice given to you by your health care provider. Make sure you discuss any questions you have with your health care provider. Document Revised: 11/26/2017 Document Reviewed: 11/26/2017 Elsevier Patient  Education  2020 Elsevier Inc.  

## 2020-03-31 LAB — RNP ANTIBODY: Ribonucleic Protein(ENA) Antibody, IgG: <1 AI

## 2020-03-31 LAB — ANCA SCREEN W REFLEX TITER: ANCA Screen: NEGATIVE

## 2020-03-31 LAB — C-REACTIVE PROTEIN: CRP: 1 mg/L (ref <8.0)

## 2020-04-02 LAB — CBC WITH DIFFERENTIAL/PLATELET
Absolute Monocytes: 576 cells/uL (ref 200–950)
Basophils Absolute: 56 cells/uL (ref 0–200)
Basophils Relative: 0.7 %
Eosinophils Absolute: 56 cells/uL (ref 15–500)
Eosinophils Relative: 0.7 %
HCT: 40.2 % (ref 35.0–45.0)
Hemoglobin: 13.2 g/dL (ref 11.7–15.5)
Lymphs Abs: 1976 cells/uL (ref 850–3900)
MCH: 28.9 pg (ref 27.0–33.0)
MCHC: 32.8 g/dL (ref 32.0–36.0)
MCV: 88.2 fL (ref 80.0–100.0)
MPV: 11.4 fL (ref 7.5–12.5)
Monocytes Relative: 7.2 %
Neutro Abs: 5336 cells/uL (ref 1500–7800)
Neutrophils Relative %: 66.7 %
Platelets: 249 10*3/uL (ref 140–400)
RBC: 4.56 10*6/uL (ref 3.80–5.10)
RDW: 12.2 % (ref 11.0–15.0)
Total Lymphocyte: 24.7 %
WBC: 8 10*3/uL (ref 3.8–10.8)

## 2020-04-02 LAB — COMPLETE METABOLIC PANEL WITH GFR
AG Ratio: 1.7 (calc) (ref 1.0–2.5)
ALT: 11 U/L (ref 6–29)
AST: 13 U/L (ref 10–30)
Albumin: 4.5 g/dL (ref 3.6–5.1)
Alkaline phosphatase (APISO): 44 U/L (ref 31–125)
BUN: 12 mg/dL (ref 7–25)
CO2: 27 mmol/L (ref 20–32)
Calcium: 9.9 mg/dL (ref 8.6–10.2)
Chloride: 103 mmol/L (ref 98–110)
Creat: 0.61 mg/dL (ref 0.50–1.10)
GFR, Est African American: 142 mL/min/{1.73_m2} (ref >=60)
GFR, Est Non African American: 123 mL/min/{1.73_m2} (ref >=60)
Globulin: 2.6 g/dL (calc) (ref 1.9–3.7)
Glucose, Bld: 83 mg/dL (ref 65–99)
Potassium: 4.3 mmol/L (ref 3.5–5.3)
Sodium: 138 mmol/L (ref 135–146)
Total Bilirubin: 0.7 mg/dL (ref 0.2–1.2)
Total Protein: 7.1 g/dL (ref 6.1–8.1)

## 2020-04-02 LAB — ANA: Anti Nuclear Antibody (ANA): POSITIVE — AB

## 2020-04-02 LAB — RHEUMATOID FACTOR: Rheumatoid fact SerPl-aCnc: <14 IU/mL (ref <14)

## 2020-04-02 LAB — TSH: TSH: 1.25 mIU/L

## 2020-04-02 LAB — ANTI-NUCLEAR AB-TITER (ANA TITER): ANA Titer 1: 1:80 {titer} — ABNORMAL HIGH

## 2020-04-02 LAB — ANTI-DNA ANTIBODY, DOUBLE-STRANDED: ds DNA Ab: 1 IU/mL

## 2020-04-02 LAB — VITAMIN D 25 HYDROXY (VIT D DEFICIENCY, FRACTURES): Vit D, 25-Hydroxy: 30 ng/mL (ref 30–100)

## 2020-04-07 DIAGNOSIS — M79641 Pain in right hand: Secondary | ICD-10-CM

## 2020-04-20 ENCOUNTER — Other Ambulatory Visit: Payer: Self-pay

## 2020-09-13 ENCOUNTER — Encounter: Payer: Self-pay | Admitting: Obstetrics and Gynecology

## 2020-09-27 NOTE — Telephone Encounter (Signed)
Jade Webster called and spoke with patient about the quote. She wrote me  "she is now scheduled for AEX for March 15 @ 2:30 and wants to make sure she can have her [place] same day. SHE IS SELF PAY and aware of cost! I have scheduled time for ONE HOUR (tentatively)  Will you please ask Dr Talbert Nan to see if she will do both same day? [name] said she lives 2 hours away and would help her due to travel."

## 2020-10-17 ENCOUNTER — Ambulatory Visit: Payer: BC Managed Care – PPO | Admitting: Obstetrics and Gynecology

## 2020-11-01 ENCOUNTER — Encounter: Payer: Self-pay | Admitting: Obstetrics and Gynecology

## 2020-11-01 ENCOUNTER — Ambulatory Visit (INDEPENDENT_AMBULATORY_CARE_PROVIDER_SITE_OTHER): Payer: Medicaid Other | Admitting: Obstetrics and Gynecology

## 2020-11-01 ENCOUNTER — Other Ambulatory Visit: Payer: Self-pay

## 2020-11-01 VITALS — BP 110/68 | HR 77 | Ht 62.0 in | Wt 182.0 lb

## 2020-11-01 DIAGNOSIS — Z308 Encounter for other contraceptive management: Secondary | ICD-10-CM | POA: Diagnosis not present

## 2020-11-01 DIAGNOSIS — Z3046 Encounter for surveillance of implantable subdermal contraceptive: Secondary | ICD-10-CM | POA: Diagnosis not present

## 2020-11-01 NOTE — Progress Notes (Signed)
GYNECOLOGY  VISIT   HPI: 30 y.o.   Single White or Caucasian Not Hispanic or Latino  female   [phone] with No LMP recorded.   here for nexplanon removal. Her partner had a vasectomy 1.5 years ago, negative SA.  She is being evaluated for fatigue, weight gain. Reports normal thyroid testing, had a +ANA, being rechecked.     GYNECOLOGIC HISTORY: No LMP recorded. Contraception: Implant  Menopausal hormone therapy:  None         OB History    Gravida  2   Para  2   Term  2   Preterm  0   AB  0   Living  2     SAB  0   IAB  0   Ectopic  0   Multiple  0   Live Births  2              Patient Active Problem List   Diagnosis Date Noted  . Cervical lymphadenopathy 09/08/2018  . GERD (gastroesophageal reflux disease) 05/05/2015  . Anxiety   . Depression   . Anemia     Past Medical History:  Diagnosis Date  . Anemia   . Anxiety   . Depression     Past Surgical History:  Procedure Laterality Date  . EYE SURGERY     for lazy eye  . nexplanon     insertion 2/14, removed 2017  . WISDOM TOOTH EXTRACTION      Current Outpatient Medications  Medication Sig Dispense Refill  . fluticasone (FLONASE) 50 MCG/ACT nasal spray daily as needed.     No current facility-administered medications for this visit.     ALLERGIES: Patient has no known allergies.  Family History  Problem Relation Age of Onset  . Diabetes Father   . Heart attack Father   . Cancer Father        leaukemia  . COPD Maternal Grandmother   . Cancer Maternal Grandmother        bone  . Stroke Maternal Grandfather   . Other Neg Hx     Social History   Socioeconomic History  . Marital status: Single    Spouse name: Not on file  . Number of children: Not on file  . Years of education: Not on file  . Highest education level: Not on file  Occupational History  . Not on file  Tobacco Use  . Smoking status: Never Smoker  . Smokeless tobacco: Never Used  Vaping Use  . Vaping Use:  Never used  Substance and Sexual Activity  . Alcohol use: Yes    Alcohol/week: 3.0 standard drinks    Types: 3 Standard drinks or equivalent per week  . Drug use: No  . Sexual activity: Yes    Partners: Male    Birth control/protection: Implant    Comment: nexplanon  Other Topics Concern  . Not on file  Social History Narrative  . Not on file   Social Determinants of Health   Financial Resource Strain: Not on file  Food Insecurity: Not on file  Transportation Needs: Not on file  Physical Activity: Not on file  Stress: Not on file  Social Connections: Not on file  Intimate Partner Violence: Not on file    Review of Systems  All other systems reviewed and are negative.   PHYSICAL EXAMINATION:    BP 110/68   Pulse 77   Ht 5\' 2"  (1.575 m)   Wt 182 lb (82.6  kg)   SpO2 100%   BMI 33.29 kg/m     General appearance: alert, cooperative and appears stated age  Risks of nexplanon removal and reinsertion were reviewed with the patient and a consent was signed.  The patient was placed in the supine position with her left arm bent at the elbow. The area was cleansed with Hibiclens and injected with 1% lidocaine. A #11 blade was used to incise over the distal end of the nexplanon implant. The implant was grasped with a clamp and removed.  The patients arm was cleansed of betadine and a steri strip was placed over the incision. A gauze was wrapped around her arm.  She tolerated the procedure well  Instructions for care were discussed.   1. Nexplanon removed Routine f/u

## 2020-11-01 NOTE — Patient Instructions (Signed)
Nexplanon Instructions After removal   Keep bandage clean and dry for 24 hours   May use ice/Tylenol/Ibuprofen for soreness or pain   If you develop fever, drainage or increased warmth from incision site-contact office immediately

## 2020-11-24 NOTE — Progress Notes (Signed)
30 y.o. [id number] Single White or Caucasian female here for annual exam.    Period Pattern:  (just had nexplanon removed 11-01-2020) Patient's last menstrual period was 11/03/2020 (exact date).      Feels better with Nexplanon out, more energy Works out M-F weights and treadmill 66min and is calorie counting x 1 month  Desires STD testing,(Hx Chlamydia last year) is back together with partner, has been in counseling x 1 year, just moved to [place]. 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    The current method of family planning is vasectomy.    Exercising: Yes.    weights & treadmill Smoker:  no  Health Maintenance: Pap:  10-14-2019 neg History of abnormal Pap:  no MMG:  none Colonoscopy:  none BMD:   none Gardasil:   completed Covid-19: pfizer Hep C testing: neg 2015 Screening Labs: coming back in June for Vit D test, will test GC/CT, HIV and RPR today   reports that she has never smoked. She has never used smokeless tobacco. She reports current alcohol use of about 1.0 standard drink of alcohol per week. She reports that she does not use drugs.  Past Medical History:  Diagnosis Date  . Anemia   . Anxiety   . Depression     Past Surgical History:  Procedure Laterality Date  . EYE SURGERY     for lazy eye  . nexplanon     insertion 2/14, removed 2017, inserted 2020 & removed 2022  . WISDOM TOOTH EXTRACTION      Current Outpatient Medications  Medication Sig Dispense Refill  . fluticasone (FLONASE) 50 MCG/ACT nasal spray daily as needed.     No current facility-administered medications for this visit.    Family History  Problem Relation Age of Onset  . Diabetes Father   . Heart attack Father   . Cancer Father        leaukemia  . COPD Maternal Grandmother   . Cancer Maternal Grandmother        bone  . Stroke Maternal Grandfather   . Other Neg Hx     Review of Systems  Constitutional: Negative.   HENT: Negative.   Eyes:  Negative.   Respiratory: Negative.   Cardiovascular: Negative.   Gastrointestinal: Negative.   Endocrine: Negative.   Genitourinary: Negative.   Musculoskeletal: Negative.   Skin: Negative.   Allergic/Immunologic: Negative.   Neurological: Negative.   Hematological: Negative.   Psychiatric/Behavioral: Negative.     Exam:   BP 112/70   Pulse 78   Resp 16   Ht 5' 2.75" (1.594 m)   Wt 181 lb (82.1 kg)   LMP 11/03/2020 (Exact Date)   BMI 32.32 kg/m   Height: 5' 2.75" (159.4 cm)  General appearance: alert, cooperative and appears stated age, no acute distress Head: Normocephalic, without obvious abnormality Neck: no adenopathy, thyroid normal to inspection and palpation Lungs: clear to auscultation bilaterally Breasts: firm nodules noted both breasts, Right breast 11 o'clock ~1cm, 3 cm from nipple, left breast 1 o'clock clustered nodules  Heart: regular rate and rhythm Abdomen: soft, non-tender; no masses,  no organomegaly Extremities: extremities normal, no edema Skin: No rashes or lesions Lymph nodes: Cervical, supraclavicular, and axillary nodes normal. No abnormal inguinal nodes palpated Neurologic: Grossly normal   Pelvic: External genitalia:  no lesions              Urethra:  normal appearing urethra with  no masses, tenderness or lesions              Bartholins and Skenes: normal                 Vagina: normal appearing vagina, appropriate for age, yellowish appearing discharge, no lesions, ph 5.0              Cervix: neg cervical motion tenderness, no visible lesions             Bimanual Exam:   Uterus:  normal size, contour, position, consistency, mobility, non-tender              Adnexa: no mass, fullness, tenderness                 Joy, CMA Chaperone was present for exam.  A:  Well woman exam with routine gynecological exam  Screening for STDs (sexually transmitted diseases) - Plan: C. trachomatis/N. gonorrhoeae RNA, WET PREP FOR TRICH, YEAST, CLUE, HIV  antibody (with reflex), RPR  Breast lump or mass- will schedule bilateral breast ultrasound  BV (bacterial vaginosis) - Plan: metroNIDAZOLE (FLAGYL) 500 MG tablet    P:   Pap :  Co-testing due 2024  Mammogram: will schedule  Labs:GC/CT, HIV, RPR  Medications:

## 2020-11-25 ENCOUNTER — Other Ambulatory Visit: Payer: Self-pay

## 2020-11-25 ENCOUNTER — Ambulatory Visit (INDEPENDENT_AMBULATORY_CARE_PROVIDER_SITE_OTHER): Payer: Medicaid Other | Admitting: Nurse Practitioner

## 2020-11-25 ENCOUNTER — Encounter: Payer: Self-pay | Admitting: Nurse Practitioner

## 2020-11-25 VITALS — BP 112/70 | HR 78 | Resp 16 | Ht 62.75 in | Wt 181.0 lb

## 2020-11-25 DIAGNOSIS — N760 Acute vaginitis: Secondary | ICD-10-CM

## 2020-11-25 DIAGNOSIS — B9689 Other specified bacterial agents as the cause of diseases classified elsewhere: Secondary | ICD-10-CM

## 2020-11-25 DIAGNOSIS — Z113 Encounter for screening for infections with a predominantly sexual mode of transmission: Secondary | ICD-10-CM

## 2020-11-25 DIAGNOSIS — N63 Unspecified lump in unspecified breast: Secondary | ICD-10-CM | POA: Diagnosis not present

## 2020-11-25 DIAGNOSIS — Z01419 Encounter for gynecological examination (general) (routine) without abnormal findings: Secondary | ICD-10-CM | POA: Diagnosis not present

## 2020-11-25 LAB — WET PREP FOR TRICH, YEAST, CLUE

## 2020-11-25 MED ORDER — METRONIDAZOLE 500 MG PO TABS: 500.0000 mg | ORAL_TABLET | Freq: Two times a day (BID) | ORAL | 0 refills | Status: DC

## 2020-11-25 NOTE — Patient Instructions (Signed)
Bacterial Vaginosis  Bacterial vaginosis is an infection that occurs when the normal balance of bacteria in the vagina changes. This change is caused by an overgrowth of certain bacteria in the vagina. Bacterial vaginosis is the most common vaginal infection among females aged 31 to 101 years. This condition increases the risk of sexually transmitted infections (STIs). Treatment can help reduce this risk. Treatment is very important for pregnant women because this condition can cause babies to be born early (prematurely) or at a low birth weight. What are the causes? This condition is caused by an increase in harmful bacteria that are normally present in small amounts in the vagina. However, the exact reason this condition develops is not known. You cannot get bacterial vaginosis from toilet seats, bedding, swimming pools, or contact with objects around you. What increases the risk? The following factors may make you more likely to develop this condition:  Having a new sexual partner or multiple sexual partners, or having unprotected sex.  Douching.  Having an intrauterine device (IUD).  Smoking.  Abusing drugs and alcohol. This may lead to riskier sexual behavior.  Taking certain antibiotic medicines.  Being pregnant. What are the signs or symptoms? Some women with this condition have no symptoms. Symptoms may include:  Pearline Cables or white vaginal discharge. The discharge can be watery or foamy.  A fish-like odor with discharge, especially after sex or during menstruation.  Itching in and around the vagina.  Burning or pain with urination. How is this diagnosed? This condition is diagnosed based on:  Your medical history.  A physical exam of the vagina.  Checking a sample of vaginal fluid for harmful bacteria or abnormal cells. How is this treated? This condition is treated with antibiotic medicines. These may be given as a pill, a vaginal cream, or a medicine that is put into the  vagina (suppository). If the condition comes back after treatment, a second round of antibiotics may be needed. Follow these instructions at home: Medicines  Take or apply over-the-counter and prescription medicines only as told by your health care provider.  Take or apply your antibiotic medicine as told by your health care provider. Do not stop using the antibiotic even if you start to feel better. General instructions  If you have a female sexual partner, tell her that you have a vaginal infection. She should follow up with her health care provider. If you have a female sexual partner, he does not need treatment.  Avoid sexual activity until you finish treatment.  Drink enough fluid to keep your urine pale yellow.  Keep the area around your vagina and rectum clean. ? Wash the area daily with warm water. ? Wipe yourself from front to back after using the toilet.  If you are breastfeeding, talk to your health care provider about continuing breastfeeding during treatment.  Keep all follow-up visits. This is important. How is this prevented? Self-care  Do not douche.  Wash the outside of your vagina with warm water only.  Wear cotton or cotton-lined underwear.  Avoid wearing tight pants and pantyhose, especially during the summer. Safe sex  Use protection when having sex. This includes: ? Using condoms. ? Using dental dams. This is a thin layer of a material made of latex or polyurethane that protects the mouth during oral sex.  Limit the number of sexual partners. To help prevent bacterial vaginosis, it is best to have sex with just one partner (monogamous relationship).  Make sure you and your sexual partner  are tested for STIs. Drugs and alcohol  Do not use any products that contain nicotine or tobacco. These products include cigarettes, chewing tobacco, and vaping devices, such as e-cigarettes. If you need help quitting, ask your health care provider.  Do not use  drugs.  Do not drink alcohol if: ? Your health care provider tells you not to do this. ? You are pregnant, may be pregnant, or are planning to become pregnant.  If you drink alcohol: ? Limit how much you have to 0-1 drink a day. ? Be aware of how much alcohol is in your drink. In the [place] one drink equals one 12 oz bottle of beer (355 mL), one 5 oz glass of wine (148 mL), or one 1 oz glass of hard liquor (44 mL). Where to find more information  Centers for Disease Control and Prevention: http://www.wolf.info/  American Sexual Health Association (ASHA): www.ashastd.org  U.S. Department of Health and Financial controller, Office on Women's Health: [place]s.tn Contact a health care provider if:  Your symptoms do not improve, even after treatment.  You have more discharge or pain when urinating.  You have a fever or chills.  You have pain in your abdomen or pelvis.  You have pain during sex.  You have vaginal bleeding between menstrual periods. Summary  Bacterial vaginosis is a vaginal infection that occurs when the normal balance of bacteria in the vagina changes. It results from an overgrowth of certain bacteria.  This condition increases the risk of sexually transmitted infections (STIs). Getting treated can help reduce this risk.  Treatment is very important for pregnant women because this condition can cause babies to be born early (prematurely) or at low birth weight.  This condition is treated with antibiotic medicines. These may be given as a pill, a vaginal cream, or a medicine that is put into the vagina (suppository). This information is not intended to replace advice given to you by your health care provider. Make sure you discuss any questions you have with your health care provider. Document Revised: 02/04/2020 Document Reviewed: 02/04/2020 Elsevier Patient Education  [place] Maintenance, Female Adopting a healthy lifestyle and getting preventive  care are important in promoting health and wellness. Ask your health care provider about:  The right schedule for you to have regular tests and exams.  Things you can do on your own to prevent diseases and keep yourself healthy. What should I know about diet, weight, and exercise? Eat a healthy diet  Eat a diet that includes plenty of vegetables, fruits, low-fat dairy products, and lean protein.  Do not eat a lot of foods that are high in solid fats, added sugars, or sodium.   Maintain a healthy weight Body mass index (BMI) is used to identify weight problems. It estimates body fat based on height and weight. Your health care provider can help determine your BMI and help you achieve or maintain a healthy weight. Get regular exercise Get regular exercise. This is one of the most important things you can do for your health. Most adults should:  Exercise for at least 150 minutes each week. The exercise should increase your heart rate and make you sweat (moderate-intensity exercise).  Do strengthening exercises at least twice a week. This is in addition to the moderate-intensity exercise.  Spend less time sitting. Even light physical activity can be beneficial. Watch cholesterol and blood lipids Have your blood tested for lipids and cholesterol at 31 years of age, then have this  test every 5 years. Have your cholesterol levels checked more often if:  Your lipid or cholesterol levels are high.  You are older than 31 years of age.  You are at high risk for heart disease. What should I know about cancer screening? Depending on your health history and family history, you may need to have cancer screening at various ages. This may include screening for:  Breast cancer.  Cervical cancer.  Colorectal cancer.  Skin cancer.  Lung cancer. What should I know about heart disease, diabetes, and high blood pressure? Blood pressure and heart disease  High blood pressure causes heart disease  and increases the risk of stroke. This is more likely to develop in people who have high blood pressure readings, are of African descent, or are overweight.  Have your blood pressure checked: ? Every 3-5 years if you are 13-54 years of age. ? Every year if you are 51 years old or older. Diabetes Have regular diabetes screenings. This checks your fasting blood sugar level. Have the screening done:  Once every three years after age 3 if you are at a normal weight and have a low risk for diabetes.  More often and at a younger age if you are overweight or have a high risk for diabetes. What should I know about preventing infection? Hepatitis B If you have a higher risk for hepatitis B, you should be screened for this virus. Talk with your health care provider to find out if you are at risk for hepatitis B infection. Hepatitis C Testing is recommended for:  Everyone born from [birth year] through 1966.  Anyone with known risk factors for hepatitis C. Sexually transmitted infections (STIs)  Get screened for STIs, including gonorrhea and chlamydia, if: ? You are sexually active and are younger than 31 years of age. ? You are older than 31 years of age and your health care provider tells you that you are at risk for this type of infection. ? Your sexual activity has changed since you were last screened, and you are at increased risk for chlamydia or gonorrhea. Ask your health care provider if you are at risk.  Ask your health care provider about whether you are at high risk for HIV. Your health care provider may recommend a prescription medicine to help prevent HIV infection. If you choose to take medicine to prevent HIV, you should first get tested for HIV. You should then be tested every 3 months for as long as you are taking the medicine. Pregnancy  If you are about to stop having your period (premenopausal) and you may become pregnant, seek counseling before you get pregnant.  Take 400 to 800  micrograms (mcg) of folic acid every day if you become pregnant.  Ask for birth control (contraception) if you want to prevent pregnancy. Osteoporosis and menopause Osteoporosis is a disease in which the bones lose minerals and strength with aging. This can result in bone fractures. If you are 25 years old or older, or if you are at risk for osteoporosis and fractures, ask your health care provider if you should:  Be screened for bone loss.  Take a calcium or vitamin D supplement to lower your risk of fractures.  Be given hormone replacement therapy (HRT) to treat symptoms of menopause. Follow these instructions at home: Lifestyle  Do not use any products that contain nicotine or tobacco, such as cigarettes, e-cigarettes, and chewing tobacco. If you need help quitting, ask your health care provider.  Do  not use street drugs.  Do not share needles.  Ask your health care provider for help if you need support or information about quitting drugs. Alcohol use  Do not drink alcohol if: ? Your health care provider tells you not to drink. ? You are pregnant, may be pregnant, or are planning to become pregnant.  If you drink alcohol: ? Limit how much you use to 0-1 drink a day. ? Limit intake if you are breastfeeding.  Be aware of how much alcohol is in your drink. In the [place] one drink equals one 12 oz bottle of beer (355 mL), one 5 oz glass of wine (148 mL), or one 1 oz glass of hard liquor (44 mL). General instructions  Schedule regular health, dental, and eye exams.  Stay current with your vaccines.  Tell your health care provider if: ? You often feel depressed. ? You have ever been abused or do not feel safe at home. Summary  Adopting a healthy lifestyle and getting preventive care are important in promoting health and wellness.  Follow your health care provider's instructions about healthy diet, exercising, and getting tested or screened for diseases.  Follow your health  care provider's instructions on monitoring your cholesterol and blood pressure. This information is not intended to replace advice given to you by your health care provider. Make sure you discuss any questions you have with your health care provider. Document Revised: 07/30/2018 Document Reviewed: 07/30/2018 Elsevier Patient Education  2021 [employer].

## 2020-11-26 LAB — C. TRACHOMATIS/N. GONORRHOEAE RNA
C. trachomatis RNA, TMA: NOT DETECTED
N. gonorrhoeae RNA, TMA: NOT DETECTED

## 2020-11-28 ENCOUNTER — Telehealth: Payer: Self-pay | Admitting: *Deleted

## 2020-11-28 LAB — HIV ANTIBODY (ROUTINE TESTING W REFLEX): HIV 1&2 Ab, 4th Generation: NONREACTIVE

## 2020-11-28 LAB — RPR: RPR Ser Ql: NONREACTIVE

## 2020-11-28 NOTE — Telephone Encounter (Addendum)
I called [place] at [facility] at [place] at [phone] scheduling and was told to fax order to [phone] and they will call patient to schedule. I informed patient with this as well. Number given to patient to call as well.

## 2020-11-28 NOTE — Telephone Encounter (Signed)
-----   Message from Karma Ganja, NP sent at 11/25/2020  4:35 PM EDT ----- This patient has bilateral breast masses. Right breast firm, smooth, about 1 cm in size located at 11 o'clock  Left breast has a cluster of lumps, cystic in nature, smooth, 1 o'clock. Can you send referral for bilateral breast Ultrasound? Since she is 28 they may require Mammogram.  She lives in [place], [place] you schedule bilateral breast US at [facility] at [place]: Address: [address] [place], [place] [address] Phone: [phone]

## 2020-12-29 ENCOUNTER — Encounter: Payer: Self-pay | Admitting: Nurse Practitioner

## 2022-10-09 NOTE — Progress Notes (Signed)
32 y.o. [id number] Single White or Caucasian Not Hispanic or Latino female here for annual exam.  Sexually active, back with her ex (kids Dad), live together.  Period Cycle (Days): 28 Period Duration (Days): 4-5 Period Pattern: Regular Menstrual Flow: Heavy Menstrual Control: Tampon Menstrual Control Change Freq (Hours): 3 Dysmenorrhea: None  She c/o a several month h/o a vaginal odor with intercourse. No itching, burning or irritation.   Patient's last menstrual period was 10/07/2022.          Sexually active: Yes.    The current method of family planning is vasectomy.    Exercising: Yes.     Walking yoga  Smoker:  no  Health Maintenance: Pap:   10-14-2019 neg  History of abnormal Pap:  no MMG:  12/29/20 Bi-rads 1 neg  BMD:   none  Colonoscopy: none  TDaP:  05/25/16 Gardasil: Complete    reports that she has never smoked. She has never used smokeless tobacco. She reports current alcohol use of about 1.0 standard drink of alcohol per week. She reports that she does not use drugs. She is an Passenger transport manager at [employer]. Kids are 10 and 6.   Past Medical History:  Diagnosis Date   Anemia    Anxiety    Depression     Past Surgical History:  Procedure Laterality Date   EYE SURGERY     for lazy eye   nexplanon     insertion 2/14, removed 2017, inserted 2020 & removed 2022   WISDOM TOOTH EXTRACTION      Current Outpatient Medications  Medication Sig Dispense Refill   escitalopram (LEXAPRO) 10 MG tablet Take 10 mg by mouth daily.     fluticasone (FLONASE) 50 MCG/ACT nasal spray daily as needed.     No current facility-administered medications for this visit.    Family History  Problem Relation Age of Onset   Diabetes Father    Heart attack Father    Cancer Father        leaukemia   COPD Maternal Grandmother    Cancer Maternal Grandmother        bone   Stroke Maternal Grandfather    Other Neg Hx     Review of Systems  Genitourinary:  Positive for vaginal  discharge.  All other systems reviewed and are negative.   Exam:   BP 112/72   Pulse 64   Ht '5\' 2"'$  (1.575 m)   Wt 183 lb (83 kg)   LMP 10/07/2022   SpO2 100%   BMI 33.47 kg/m   Weight change: '@WEIGHTCHANGE'$ @ Height:   Height: '5\' 2"'$  (157.5 cm)  Ht Readings from Last 3 Encounters:  10/16/22 '5\' 2"'$  (1.575 m)  11/25/20 5' 2.75" (1.594 m)  11/01/20 '5\' 2"'$  (1.575 m)    General appearance: alert, cooperative and appears stated age Head: Normocephalic, without obvious abnormality, atraumatic Neck: no adenopathy, supple, symmetrical, trachea midline and thyroid normal to inspection and palpation Lungs: clear to auscultation bilaterally Cardiovascular: regular rate and rhythm Breasts: normal appearance, no masses or tenderness Abdomen: soft, non-tender; non distended,  no masses,  no organomegaly Extremities: extremities normal, atraumatic, no cyanosis or edema Skin: Skin color, texture, turgor normal. No rashes or lesions Lymph nodes: Cervical, supraclavicular, and axillary nodes normal. No abnormal inguinal nodes palpated Neurologic: Grossly normal   Pelvic: External genitalia:  no lesions              Urethra:  normal appearing urethra with no masses, tenderness  or lesions              Bartholins and Skenes: normal                 Vagina: normal appearing vagina with normal color and discharge, no lesions              Cervix: no lesions and friable with pap               Bimanual Exam:  Uterus:  normal size, contour, position, consistency, mobility, non-tender and anteverted              Adnexa: no mass, fullness, tenderness               Rectovaginal: Confirms               Anus:  normal sphincter tone, no lesions  Gae Dry, CMA chaperoned for the exam.  1. Well woman exam Discussed breast self exam Discussed calcium and vit D intake  2. Screening for cervical cancer - Cytology - PAP  3. Screening examination for STD (sexually transmitted disease) - Cytology - PAP -  RPR - HIV Antibody (routine testing w rflx) - Hepatitis C antibody  4. Vaginal odor (intermittent)  - WET PREP FOR TRICH, YEAST, CLUE: negative -normal exam

## 2022-10-16 ENCOUNTER — Ambulatory Visit (INDEPENDENT_AMBULATORY_CARE_PROVIDER_SITE_OTHER): Payer: Medicaid Other | Admitting: Obstetrics and Gynecology

## 2022-10-16 ENCOUNTER — Encounter: Payer: Self-pay | Admitting: Obstetrics and Gynecology

## 2022-10-16 ENCOUNTER — Other Ambulatory Visit (HOSPITAL_COMMUNITY)
Admission: RE | Admit: 2022-10-16 | Discharge: 2022-10-16 | Disposition: A | Discharge status: Home / Self Care | Payer: Medicaid Other | Source: Ambulatory Visit | Attending: Obstetrics and Gynecology | Admitting: Obstetrics and Gynecology

## 2022-10-16 VITALS — BP 112/72 | HR 64 | Ht 62.0 in | Wt 183.0 lb

## 2022-10-16 DIAGNOSIS — N898 Other specified noninflammatory disorders of vagina: Secondary | ICD-10-CM

## 2022-10-16 DIAGNOSIS — Z124 Encounter for screening for malignant neoplasm of cervix: Secondary | ICD-10-CM | POA: Diagnosis present

## 2022-10-16 DIAGNOSIS — Z113 Encounter for screening for infections with a predominantly sexual mode of transmission: Secondary | ICD-10-CM

## 2022-10-16 DIAGNOSIS — Z833 Family history of diabetes mellitus: Secondary | ICD-10-CM

## 2022-10-16 DIAGNOSIS — E559 Vitamin D deficiency, unspecified: Secondary | ICD-10-CM

## 2022-10-16 DIAGNOSIS — Z01419 Encounter for gynecological examination (general) (routine) without abnormal findings: Secondary | ICD-10-CM

## 2022-10-16 LAB — WET PREP FOR TRICH, YEAST, CLUE

## 2022-10-16 NOTE — Patient Instructions (Signed)

## 2022-10-17 LAB — HIV ANTIBODY (ROUTINE TESTING W REFLEX): HIV 1&2 Ab, 4th Generation: NONREACTIVE

## 2022-10-17 LAB — HEPATITIS C ANTIBODY: Hepatitis C Ab: NONREACTIVE

## 2022-10-17 LAB — HEMOGLOBIN A1C
Hgb A1c MFr Bld: 5.4 % of total Hgb (ref <5.7)
Mean Plasma Glucose: 108 mg/dL
eAG (mmol/L): 6 mmol/L

## 2022-10-17 LAB — VITAMIN D 25 HYDROXY (VIT D DEFICIENCY, FRACTURES): Vit D, 25-Hydroxy: 30 ng/mL (ref 30–100)

## 2022-10-17 LAB — RPR: RPR Ser Ql: NONREACTIVE

## 2022-10-18 LAB — CYTOLOGY - PAP
Chlamydia: NEGATIVE
Comment: NEGATIVE
Comment: NEGATIVE
Comment: NEGATIVE
Comment: NORMAL
Diagnosis: NEGATIVE
High risk HPV: NEGATIVE
Neisseria Gonorrhea: NEGATIVE
Trichomonas: NEGATIVE

## 2023-04-18 ENCOUNTER — Encounter (HOSPITAL_BASED_OUTPATIENT_CLINIC_OR_DEPARTMENT_OTHER): Payer: Self-pay | Admitting: Emergency Medicine

## 2023-04-18 ENCOUNTER — Other Ambulatory Visit: Payer: Self-pay

## 2023-04-18 ENCOUNTER — Emergency Department (HOSPITAL_BASED_OUTPATIENT_CLINIC_OR_DEPARTMENT_OTHER): Payer: Medicaid Other

## 2023-04-18 ENCOUNTER — Emergency Department (HOSPITAL_BASED_OUTPATIENT_CLINIC_OR_DEPARTMENT_OTHER)
Admission: EM | Admit: 2023-04-18 | Discharge: 2023-04-18 | Disposition: A | Discharge status: Home / Self Care | Payer: Medicaid Other | Attending: Emergency Medicine | Admitting: Emergency Medicine

## 2023-04-18 DIAGNOSIS — R1013 Epigastric pain: Secondary | ICD-10-CM | POA: Diagnosis not present

## 2023-04-18 DIAGNOSIS — R1010 Upper abdominal pain, unspecified: Secondary | ICD-10-CM | POA: Diagnosis present

## 2023-04-18 DIAGNOSIS — D72829 Elevated white blood cell count, unspecified: Secondary | ICD-10-CM | POA: Diagnosis not present

## 2023-04-18 DIAGNOSIS — R197 Diarrhea, unspecified: Secondary | ICD-10-CM | POA: Insufficient documentation

## 2023-04-18 LAB — CBC
HCT: 38 % (ref 36.0–46.0)
Hemoglobin: 12.6 g/dL (ref 12.0–15.0)
MCH: 29 pg (ref 26.0–34.0)
MCHC: 33.2 g/dL (ref 30.0–36.0)
MCV: 87.4 fL (ref 80.0–100.0)
Platelets: 240 10*3/uL (ref 150–400)
RBC: 4.35 MIL/uL (ref 3.87–5.11)
RDW: 12.2 % (ref 11.5–15.5)
WBC: 11.3 10*3/uL — ABNORMAL HIGH (ref 4.0–10.5)
nRBC: 0 % (ref 0.0–0.2)

## 2023-04-18 LAB — URINALYSIS, ROUTINE W REFLEX MICROSCOPIC
Bilirubin Urine: NEGATIVE
Glucose, UA: NEGATIVE mg/dL
Hgb urine dipstick: NEGATIVE
Ketones, ur: NEGATIVE mg/dL
Leukocytes,Ua: NEGATIVE
Nitrite: NEGATIVE
Protein, ur: NEGATIVE mg/dL
Specific Gravity, Urine: 1.02 (ref 1.005–1.030)
pH: 6.5 (ref 5.0–8.0)

## 2023-04-18 LAB — COMPREHENSIVE METABOLIC PANEL
ALT: 19 U/L (ref 0–44)
AST: 16 U/L (ref 15–41)
Albumin: 3.9 g/dL (ref 3.5–5.0)
Alkaline Phosphatase: 44 U/L (ref 38–126)
Anion gap: 10 (ref 5–15)
BUN: 10 mg/dL (ref 6–20)
CO2: 22 mmol/L (ref 22–32)
Calcium: 8.7 mg/dL — ABNORMAL LOW (ref 8.9–10.3)
Chloride: 106 mmol/L (ref 98–111)
Creatinine, Ser: 0.74 mg/dL (ref 0.44–1.00)
GFR, Estimated: >60 mL/min (ref >60)
Glucose, Bld: 88 mg/dL (ref 70–99)
Potassium: 3.6 mmol/L (ref 3.5–5.1)
Sodium: 138 mmol/L (ref 135–145)
Total Bilirubin: 0.6 mg/dL (ref 0.3–1.2)
Total Protein: 6.8 g/dL (ref 6.5–8.1)

## 2023-04-18 LAB — PREGNANCY, URINE: Preg Test, Ur: NEGATIVE

## 2023-04-18 LAB — LIPASE, BLOOD: Lipase: 33 U/L (ref 11–51)

## 2023-04-18 MED ORDER — IOHEXOL 300 MG/ML  SOLN
100.0000 mL | Freq: Once | INTRAMUSCULAR | Status: AC | PRN
  Administered 2023-04-18: 100 mL via INTRAVENOUS

## 2023-04-18 MED ORDER — ALUM & MAG HYDROXIDE-SIMETH 200-200-20 MG/5ML PO SUSP
15.0000 mL | Freq: Once | ORAL | Status: AC
  Administered 2023-04-18: 15 mL via ORAL
  Filled 2023-04-18: qty 30

## 2023-04-18 MED ORDER — FAMOTIDINE 20 MG PO TABS
20.0000 mg | ORAL_TABLET | Freq: Once | ORAL | Status: AC
  Administered 2023-04-18: 20 mg via ORAL
  Filled 2023-04-18: qty 1

## 2023-04-18 MED ORDER — ONDANSETRON 4 MG PO TBDP
4.0000 mg | ORAL_TABLET | Freq: Once | ORAL | Status: AC | PRN
  Administered 2023-04-18: 4 mg via ORAL
  Filled 2023-04-18: qty 1

## 2023-04-18 MED ORDER — ONDANSETRON 4 MG PO TBDP: 4.0000 mg | ORAL_TABLET | Freq: Three times a day (TID) | ORAL | 0 refills | Status: AC | PRN

## 2023-04-18 NOTE — ED Triage Notes (Signed)
Pt POV c/o recurrent upper abd pain after eating x 2 weeks. Pain progressively worsening after eating.   C/o nausea. Denies emesis, diarrhea.

## 2023-04-18 NOTE — Discharge Instructions (Addendum)
As discussed, workup today overall reassuring.  Laboratory studies evidence of slight elevation of white count but otherwise, unremarkable for any acute process.  CT imaging of your abdomen/pelvis did not show any abnormality.  Will recommend continued use of reflux medicine to see if you find this beneficial in the outpatient setting.  Recommend follow-up with GI in the outpatient setting for reevaluation of your symptoms as well as continued dietary modifications per recommended by gastroenterology.  Please do not hesitate to return to emergency department if there are worrisome signs and symptoms we discussed to become apparent.

## 2023-04-18 NOTE — ED Notes (Signed)
Pt advised she has immediate pain related to eating any portion of food. Onset is 30 minutes after eating, and lasts for 1-2 hours each and every time she eats. Has a colonoscopy scheduled on 11/5 and saw Gastro on 8/26. No recent trauma, at home preg test negative. Pt advised has not tried anything to see if it gets better with tx after eating (I.e. has not tried Tums or Pepcid, etc).

## 2023-04-18 NOTE — ED Provider Notes (Signed)
[facility] Provider Note   CSN: [id number] Arrival date & time: 04/18/23  1135     History  Chief Complaint  Patient presents with   Abdominal Pain    Jade Webster is a 32 y.o. female.   Abdominal Pain   32 year old female presents emergency department with complaints of upper abdominal pain.  Patient states that she has been dealing with upper abdominal pain after eating for approximately past 2 weeks.  Reports 4-5 episodes that occur 30 minutes or so after eating and last for 1 to 2 hours before spontaneous resolution.  Has been following with GI in the outpatient setting due to abdominal cramping, nausea, bloating, diarrhea.  Has upcoming appointment for colonoscopy/endoscopy in November.  States that she had an additional episode earlier today prompting visit to the emergency department.  States that episode feels similar in nature to prior episodes experience.  Denies any fever, chills, emesis, urinary symptoms, vaginal symptoms, change in bowel habits from baseline.  Her does report some associated nausea.  Describes pain as cramping without radiation.  Past medical history significant for anemia, anxiety, depression, cervical lymphadenopathy,  Home Medications Prior to Admission medications   Medication Sig Start Date End Date Taking? Authorizing Provider  ondansetron (ZOFRAN-ODT) 4 MG disintegrating tablet Take 1 tablet (4 mg total) by mouth every 8 (eight) hours as needed. 04/18/23  Yes Sherian Maroon A, PA  escitalopram (LEXAPRO) 10 MG tablet Take 10 mg by mouth daily.    [provider]  fluticasone (FLONASE) 50 MCG/ACT nasal spray daily as needed. 08/17/18   [provider]      Allergies    Patient has no known allergies.    Review of Systems   Review of Systems  Gastrointestinal:  Positive for abdominal pain.  All other systems reviewed and are negative.   Physical Exam Updated Vital Signs BP  129/85 (BP Location: Right Arm)   Pulse 75   Temp 97.7 F (36.5 C)   Resp 20   Ht 5\' 2"  (1.575 m)   Wt 80.7 kg   LMP 04/14/2023 (Exact Date)   SpO2 100%   BMI 32.56 kg/m  Physical Exam Vitals and nursing note reviewed.  Constitutional:      General: She is not in acute distress.    Appearance: She is well-developed.  HENT:     Head: Normocephalic and atraumatic.  Eyes:     Conjunctiva/sclera: Conjunctivae normal.  Cardiovascular:     Rate and Rhythm: Normal rate and regular rhythm.     Heart sounds: No murmur heard. Pulmonary:     Effort: Pulmonary effort is normal. No respiratory distress.     Breath sounds: Normal breath sounds.  Abdominal:     Palpations: Abdomen is soft.     Tenderness: There is abdominal tenderness in the epigastric area. There is no right CVA tenderness, left CVA tenderness or guarding. Negative signs include Murphy's sign and McBurney's sign.     Comments: Mild epigastric tenderness to palpation.  Musculoskeletal:        General: No swelling.     Cervical back: Neck supple.  Skin:    General: Skin is warm and dry.     Capillary Refill: Capillary refill takes less than 2 seconds.  Neurological:     Mental Status: She is alert.  Psychiatric:        Mood and Affect: Mood normal.     ED Results / Procedures / Treatments  Labs (all labs ordered are listed, but only abnormal results are displayed) Labs Reviewed  COMPREHENSIVE METABOLIC PANEL - Abnormal; Notable for the following components:      Result Value   Calcium 8.7 (*)    All other components within normal limits  CBC - Abnormal; Notable for the following components:   WBC 11.3 (*)    All other components within normal limits  LIPASE, BLOOD  URINALYSIS, ROUTINE W REFLEX MICROSCOPIC  PREGNANCY, URINE    EKG None  Radiology CT ABDOMEN PELVIS W CONTRAST  Result Date: 04/18/2023 CLINICAL DATA:  Recurrent postprandial abdominal pain for 2 weeks, nausea EXAM: CT ABDOMEN AND PELVIS  WITH CONTRAST TECHNIQUE: Multidetector CT imaging of the abdomen and pelvis was performed using the standard protocol following bolus administration of intravenous contrast. RADIATION DOSE REDUCTION: This exam was performed according to the departmental dose-optimization program which includes automated exposure control, adjustment of the mA and/or kV according to patient size and/or use of iterative reconstruction technique. CONTRAST:  OMNIPAQUE IOHEXOL 300 MG/ML  SOLN COMPARISON:  None Available. FINDINGS: Lower chest: No acute pleural or parenchymal lung disease. Hepatobiliary: No focal liver abnormality is seen. No gallstones, gallbladder wall thickening, or biliary dilatation. Pancreas: Unremarkable. No pancreatic ductal dilatation or surrounding inflammatory changes. Spleen: Normal in size without focal abnormality. Adrenals/Urinary Tract: Adrenal glands are unremarkable. Kidneys are normal, without renal calculi, focal lesion, or hydronephrosis. Bladder is unremarkable. Stomach/Bowel: No bowel obstruction or ileus. Normal appendix right lower quadrant. No bowel wall thickening or inflammatory change. Vascular/Lymphatic: No significant vascular findings are present. No enlarged abdominal or pelvic lymph nodes. Reproductive: Uterus and bilateral adnexa are unremarkable. Other: No free fluid or free intraperitoneal gas. No abdominal wall hernia. Musculoskeletal: No acute or destructive bony abnormalities. Reconstructed images demonstrate no additional findings. IMPRESSION: 1. No acute intra-abdominal or intrapelvic process. Electronically Signed   By: Sharlet Salina M.D.   On: 04/18/2023 15:10    Procedures Procedures    Medications Ordered in ED Medications  ondansetron (ZOFRAN-ODT) disintegrating tablet 4 mg (4 mg Oral Given 04/18/23 1156)  alum & mag hydroxide-simeth (MAALOX/MYLANTA) 200-200-20 MG/5ML suspension 15 mL (15 mLs Oral Given 04/18/23 1354)  famotidine (PEPCID) tablet 20 mg (20 mg  Oral Given 04/18/23 1354)  iohexol (OMNIPAQUE) 300 MG/ML solution 100 mL (100 mLs Intravenous Contrast Given 04/18/23 1436)    ED Course/ Medical Decision Making/ A&P                                 Medical Decision Making Amount and/or Complexity of Data Reviewed Labs: ordered. Radiology: ordered.  Risk OTC drugs. Prescription drug management.   This patient presents to the ED for concern of abdominal pain, this involves an extensive number of treatment options, and is a complaint that carries with it a high risk of complications and morbidity.  The differential diagnosis includes gastritis, PUD, CBD pathology, cholecystitis, SBO/LBO, volvulus, IBS, ectopic pregnancy, ovarian torsion, pyelonephritis, nephrolithiasis, cystitis   Co morbidities that complicate the patient evaluation  See HPI   Additional history obtained:  Additional history obtained from EMR External records from outside source obtained and reviewed including hospital records   Lab Tests:  I Ordered, and personally interpreted labs.  The pertinent results include: Leukocytosis of 11.3.  No evidence of anemia.  Platelets within range.  Mild hypercalcemia of 8.7 otherwise, electrolytes within normal limits.  No transaminitis.  No renal dysfunction.  Urine  pregnancy negative.  UA without abnormality.   Imaging Studies ordered:  I ordered imaging studies including CT abdomen pelvis I independently visualized and interpreted imaging which showed no acute abnormalities I agree with the radiologist interpretation   Cardiac Monitoring: / EKG:  The patient was maintained on a cardiac monitor.  I personally viewed and interpreted the cardiac monitored which showed an underlying rhythm of: Sinus rhythm   Consultations Obtained:  N/a   Problem List / ED Course / Critical interventions / Medication management  Upper abdominal pain I ordered medication including Pepcid, Mylanta, Zofran   Reevaluation of the  patient after these medicines showed that the patient improved I have reviewed the patients home medicines and have made adjustments as needed   Social Determinants of Health:  Denies tobacco, illicit drug use   Test / Admission - Considered:  Upper abdominal pain Vitals signs within normal range and stable throughout visit. Laboratory/imaging studies significant for: See above 32 year old female presents emergency department with complaints of upper abdominal pain.  Patient reports multiple episodes over the past 2 weeks with sharp cramping upper abdominal pain 30 or so minutes after consumption of food with relief 1 to 2 hours after symptom onset.  Per patient, has upcoming endoscopy as well as colonoscopy scheduled with GI in the outpatient setting due to more chronic GI issues with abdominal cramping/bloating/diarrhea. On exam, patient with mild epigastric tenderness to palpation.  Given somewhat colicky nature of abdominal pain, imaging study was pursued in form of CT imaging.  CT imaging negative for any acute abnormality without evidence of cholelithiasis.  Laboratory studies largely unremarkable for any acute process.  Unsure of exact etiology of patient's abdominal pain but seems not to be emergent process.  She did note possible improvement with GI cocktail thus will recommend continued use of antireflux medications in the outpatient setting.  Close follow-up with gastroenterology recommended for reevaluation of symptoms.  Treatment plan discussed at length with patient and she acknowledged understanding was agreeable to said plan.  Patient overall well-appearing, afebrile in no acute distress. Worrisome signs and symptoms were discussed with the patient, and the patient acknowledged understanding to return to the ED if noticed. Patient was stable upon discharge.          Final Clinical Impression(s) / ED Diagnoses Final diagnoses:  Upper abdominal pain    Rx / DC Orders ED  Discharge Orders          Ordered    ondansetron (ZOFRAN-ODT) 4 MG disintegrating tablet  Every 8 hours PRN        04/18/23 1517              Peter Garter, [place] 04/18/23 1837    Terrilee Files, MD 04/19/23 1024

## 2024-05-21 ENCOUNTER — Ambulatory Visit: Payer: Self-pay

## 2024-05-26 NOTE — Progress Notes (Incomplete)
 33 y.o. [id number] female here for annual exam. Single. PCP: Tonita Fallow, MD (Inactive)  No LMP recorded.    She reports ***. Urine sample provided: ***  Abnormal bleeding: *** Pelvic discharge or pain: *** Breast mass, nipple discharge or skin changes : ***  Sexually active: ***  Birth control: *** Last PAP:     Component Value Date/Time   DIAGPAP  10/16/2022 1549    - Negative for intraepithelial lesion or malignancy (NILM)   DIAGPAP  10/14/2019 0850    - Negative for intraepithelial lesion or malignancy (NILM)   HPVHIGH Negative 10/16/2022 1549   ADEQPAP  10/16/2022 1549    Satisfactory for evaluation; transformation zone component PRESENT.   ADEQPAP  10/14/2019 0850    Satisfactory for evaluation; transformation zone component PRESENT.   Gardasil: ***  Exercising: *** Smoker: ***       GYN HISTORY: ***  OB History  Gravida Para Term Preterm AB Living  2 2 2  0 0 2  SAB IAB Ectopic Multiple Live Births  0 0 0 0 2    # Outcome Date GA Lbr Len/2nd Weight Sex Type Anes PTL Lv  2 Term 08/04/16 [redacted]w[redacted]d 04:17 / 00:24 6 lb 11.8 oz (3.055 kg) M Vag-Spont EPI  LIV     Birth Comments: WNL  1 Term 08/29/12 [redacted]w[redacted]d 08:00 / 01:34 6 lb 10.5 oz (3.02 kg) F Vag-Spont EPI  LIV   Past Medical History:  Diagnosis Date   Anemia    Anxiety    Depression    Past Surgical History:  Procedure Laterality Date   EYE SURGERY     for lazy eye   nexplanon      insertion 2/14, removed 2017, inserted 2020 & removed 2022   WISDOM TOOTH EXTRACTION     Current Outpatient Medications on File Prior to Visit  Medication Sig Dispense Refill   escitalopram (LEXAPRO) 10 MG tablet Take 10 mg by mouth daily.     fluticasone (FLONASE) 50 MCG/ACT nasal spray daily as needed.     ondansetron  (ZOFRAN -ODT) 4 MG disintegrating tablet Take 1 tablet (4 mg total) by mouth every 8 (eight) hours as needed. 20 tablet 0   No current facility-administered medications on file prior to visit.    Social History   Socioeconomic History   Marital status: Single    Spouse name: Not on file   Number of children: Not on file   Years of education: Not on file   Highest education level: Not on file  Occupational History   Not on file  Tobacco Use   Smoking status: Never   Smokeless tobacco: Never  Vaping Use   Vaping status: Never Used  Substance and Sexual Activity   Alcohol use: Yes    Alcohol/week: 1.0 standard drink of alcohol    Types: 1 Standard drinks or equivalent per week   Drug use: No   Sexual activity: Yes    Partners: Male    Birth control/protection: Other-see comments    Comment: vasectomy  Other Topics Concern   Not on file  Social History Narrative   Not on file   Social Drivers of Health   Financial Resource Strain: Not on file  Food Insecurity: Not on file  Transportation Needs: Not on file  Physical Activity: Not on file  Stress: Not on file  Social Connections: Unknown (04/11/2023)   [facility]   Social Network    Social Network: Not on file  Intimate Partner  Violence: Unknown (04/11/2023)   [facility]   HITS    Physically Hurt: Not on file    Insult or Talk Down To: Not on file    Threaten Physical Harm: Not on file    Scream or Curse: Not on file   Family History  Problem Relation Age of Onset   Diabetes Father    Heart attack Father    Cancer Father        leaukemia   COPD Maternal Grandmother    Cancer Maternal Grandmother        bone   Stroke Maternal Grandfather    Other Neg Hx    No Known Allergies  PE There were no vitals filed for this visit. There is no height or weight on file to calculate BMI.  Physical Exam    Assessment and Plan:        There are no diagnoses linked to this encounter. Clotilda FORBES Pa, CMA

## 2024-05-27 ENCOUNTER — Ambulatory Visit: Payer: Self-pay | Admitting: Obstetrics and Gynecology
